# Patient Record
Sex: Female | Born: 2000 | Race: Black or African American | Hispanic: No | Marital: Single | Smoking: Never smoker
Health system: Southern US, Community
[De-identification: ages and names within clinical notes are randomized; demographics above are authoritative.]

## PROBLEM LIST (undated history)

## (undated) DIAGNOSIS — R51 Headache: Secondary | ICD-10-CM

## (undated) DIAGNOSIS — R519 Headache, unspecified: Secondary | ICD-10-CM

## (undated) HISTORY — DX: Headache: R51

## (undated) HISTORY — PX: NO PAST SURGERIES: SHX2092

## (undated) HISTORY — DX: Headache, unspecified: R51.9

---
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
## 2020-03-10 NOTE — L&D Delivery Note (Signed)
Delivery Note Pushed for almost 2 hours and was able to progress to delivery  At 12:24 AM a viable and healthy female was delivered via Vaginal, Spontaneous (Presentation: Middle Occiput Anterior).  APGAR: 8, 9; weight  .   Placenta status: Spontaneous, Intact.  Cord: 3 vessels with the following complications: None.    Anesthesia: Epidural Episiotomy: None Lacerations: 1st degree;Perineal Suture Repair:  4-0 monocryl Est. Blood Loss (mL): 1013  Mom to postpartum.  Baby to Couplet care / Skin to Skin.  Wynelle Bourgeois 03/03/2021, 1:14 AM  Please schedule this patient for Postpartum visit in: 1 week with the following provider: Any provider In-Person For C/S patients schedule nurse incision check in weeks 2 weeks: no High Risk pregnancy complicated by: HTN Delivery mode:  SVD Anticipated Birth Control:  POPs PP Procedures needed: BP check  Edinburgh: negative Schedule Integrated BH visit: no  No relevant baby issues

## 2020-07-05 ENCOUNTER — Ambulatory Visit: Payer: Self-pay

## 2020-07-05 ENCOUNTER — Encounter: Payer: Self-pay | Admitting: Obstetrics and Gynecology

## 2020-07-05 ENCOUNTER — Other Ambulatory Visit: Payer: Self-pay

## 2020-07-05 DIAGNOSIS — Z3200 Encounter for pregnancy test, result unknown: Secondary | ICD-10-CM

## 2020-07-05 DIAGNOSIS — Z3201 Encounter for pregnancy test, result positive: Secondary | ICD-10-CM

## 2020-07-05 LAB — POCT URINE PREGNANCY: Preg Test, Ur: POSITIVE — AB

## 2020-07-05 NOTE — Progress Notes (Signed)
Patient was assessed and managed by nursing staff during this encounter. I have reviewed the chart and agree with the documentation and plan. I have also made any necessary editorial changes.  Usbaldo Pannone, MD 07/05/2020 3:47 PM    

## 2020-07-05 NOTE — Progress Notes (Signed)
..   Joyce Taylor presents today for UPT. She has no unusual complaints. LMP:05-23-20    OBJECTIVE: Appears well, in no apparent distress.  OB History    Gravida  1   Para      Term      Preterm      AB      Living        SAB      IAB      Ectopic      Multiple      Live Births             Home UPT Result:Positive In-Office UPT result:Positive I have reviewed the patient's medical, obstetrical, social, and family histories, and medications.   ASSESSMENT: Positive pregnancy test  PLAN Prenatal care to be completed at: [facility]

## 2020-08-15 ENCOUNTER — Other Ambulatory Visit: Payer: Self-pay

## 2020-08-15 ENCOUNTER — Ambulatory Visit (INDEPENDENT_AMBULATORY_CARE_PROVIDER_SITE_OTHER): Payer: 59

## 2020-08-15 VITALS — BP 119/80 | HR 84 | Ht 67.0 in | Wt 132.5 lb

## 2020-08-15 DIAGNOSIS — Z3A12 12 weeks gestation of pregnancy: Secondary | ICD-10-CM

## 2020-08-15 DIAGNOSIS — Z3401 Encounter for supervision of normal first pregnancy, first trimester: Secondary | ICD-10-CM

## 2020-08-15 DIAGNOSIS — Z3403 Encounter for supervision of normal first pregnancy, third trimester: Secondary | ICD-10-CM | POA: Insufficient documentation

## 2020-08-15 MED ORDER — BLOOD PRESSURE KIT DEVI: 1.0000 | 0 refills | Status: AC

## 2020-08-15 NOTE — Progress Notes (Signed)
New OB Intake  I connected with  Tera Mater on 08/15/20 at  3:15 PM EDT by in person and verified that I am speaking with the correct person using two identifiers. Nurse is located at [facility] and pt is located at [place].  I discussed the limitations, risks, security and privacy concerns of performing an evaluation and management service by telephone and the availability of in person appointments. I also discussed with the patient that there may be a patient responsible charge related to this service. The patient expressed understanding and agreed to proceed.  I explained I am completing New OB Intake today. We discussed her EDD of 02/27/21 that is based on LMP of 05/23/20. Pt is G1/P0. I reviewed her allergies, medications, Medical/Surgical/OB history, and appropriate screenings. I informed her of [facility] services. Based on history, this is a/an uncomplicated pregnancy.  There are no problems to display for this patient.   Concerns addressed today  Delivery Plans:  Plans to deliver at [facility].   MyChart/Babyscripts MyChart access verified. I explained pt will have some visits in office and some virtually. Babyscripts instructions given and order placed. Patient verifies receipt of registration text/e-mail. Account successfully created and app downloaded.  Blood Pressure Cuff Blood pressure cuff ordered for patient to pick-up from [employer]. Explained after first prenatal appt pt will check weekly and document in Babyscripts.  Anatomy US Explained first scheduled [place] will be around 19 weeks. Patient has had a dating scan at [facility]  Labs Discussed Avelina Laine genetic screening with patient. Would like both Panorama and Horizon drawn at new OB visit. Routine prenatal labs needed.  Covid Vaccine Patient has covid vaccine.   Social Determinants of Health . Food Insecurity: Patient denies food insecurity. . WIC Referral: Patient is interested in referral to [facility].   . Transportation: Patient denies transportation needs. . Childcare: Discussed no children allowed at ultrasound appointments. Offered childcare services; patient declines childcare services at this time.  First visit review I reviewed new OB appt with pt. I explained she will have a pelvic exam, ob bloodwork with genetic screening, and PAP smear. Explained pt will be seen by Coral Ceo at first visit; encounter routed to appropriate provider. Explained that patient will be seen by pregnancy navigator following visit with provider.  Hamilton Capri, RN 08/15/2020  3:05 PM

## 2020-08-16 NOTE — Progress Notes (Signed)
Chart reviewed for nurse visit. Agree with plan of care.   Rakesh Dutko M, MD 08/16/20 10:28 AM 

## 2020-08-22 ENCOUNTER — Other Ambulatory Visit: Payer: Self-pay

## 2020-08-22 ENCOUNTER — Other Ambulatory Visit (HOSPITAL_COMMUNITY)
Admission: RE | Admit: 2020-08-22 | Discharge: 2020-08-22 | Disposition: A | Discharge status: Home / Self Care | Payer: 59 | Source: Ambulatory Visit | Attending: Obstetrics | Admitting: Obstetrics

## 2020-08-22 ENCOUNTER — Encounter: Payer: Self-pay | Admitting: Obstetrics

## 2020-08-22 ENCOUNTER — Ambulatory Visit (INDEPENDENT_AMBULATORY_CARE_PROVIDER_SITE_OTHER): Payer: 59 | Admitting: Obstetrics

## 2020-08-22 VITALS — Wt 134.4 lb

## 2020-08-22 DIAGNOSIS — Z3400 Encounter for supervision of normal first pregnancy, unspecified trimester: Secondary | ICD-10-CM

## 2020-08-22 DIAGNOSIS — Z3A13 13 weeks gestation of pregnancy: Secondary | ICD-10-CM | POA: Diagnosis not present

## 2020-08-22 DIAGNOSIS — Z3401 Encounter for supervision of normal first pregnancy, first trimester: Secondary | ICD-10-CM | POA: Insufficient documentation

## 2020-08-22 LAB — HEPATITIS C ANTIBODY: HCV Ab: NEGATIVE

## 2020-08-22 NOTE — Progress Notes (Signed)
Subjective:    Joyce Taylor is being seen today for her first obstetrical visit.  This is not a planned pregnancy. She is at [redacted]w[redacted]d gestation. Her obstetrical history is significant for  nothing . Relationship with FOB: significant other, not living together. Patient does intend to breast feed. Pregnancy history fully reviewed.  The information documented in the HPI was reviewed and verified.  Menstrual History: OB History     Gravida  1   Para      Term      Preterm      AB      Living         SAB      IAB      Ectopic      Multiple      Live Births               Patient's last menstrual period was 05/23/2020.    Past Medical History:  Diagnosis Date   Headache     History reviewed. No pertinent surgical history.  (Not in a hospital admission)  No Known Allergies  Social History   Tobacco Use   Smoking status: Never   Smokeless tobacco: Never  Substance Use Topics   Alcohol use: Not Currently    Comment: not since confirmed pregnancy    Family History  Problem Relation Age of Onset   Breast cancer Mother 39     Review of Systems Constitutional: negative for weight loss Gastrointestinal: negative for vomiting Genitourinary:negative for genital lesions and vaginal discharge and dysuria Musculoskeletal:negative for back pain Behavioral/Psych: negative for abusive relationship, depression, illegal drug usage and tobacco use    Objective:    Wt 134 lb 6.4 oz (61 kg)   LMP 05/23/2020   BMI 21.05 kg/m  General Appearance:    Alert, cooperative, no distress, appears stated age  Head:    Normocephalic, without obvious abnormality, atraumatic  Eyes:    PERRL, conjunctiva/corneas clear, EOM's intact, fundi    benign, both eyes  Ears:    Normal TM's and external ear canals, both ears  Nose:   Nares normal, septum midline, mucosa normal, no drainage    or sinus tenderness  Throat:   Lips, mucosa, and tongue normal; teeth and gums normal  Neck:    Supple, symmetrical, trachea midline, no adenopathy;    thyroid:  no enlargement/tenderness/nodules; no carotid   bruit or JVD  Back:     Symmetric, no curvature, ROM normal, no CVA tenderness  Lungs:     Clear to auscultation bilaterally, respirations unlabored  Chest Wall:    No tenderness or deformity   Heart:    Regular rate and rhythm, S1 and S2 normal, no murmur, rub   or gallop  Breast Exam:    No tenderness, masses, or nipple abnormality  Abdomen:     Soft, non-tender, bowel sounds active all four quadrants,    no masses, no organomegaly  Genitalia:    Normal female without lesion, discharge or tenderness  Extremities:   Extremities normal, atraumatic, no cyanosis or edema  Pulses:   2+ and symmetric all extremities  Skin:   Skin color, texture, turgor normal, no rashes or lesions  Lymph nodes:   Cervical, supraclavicular, and axillary nodes normal  Neurologic:   CNII-XII intact, normal strength, sensation and reflexes    throughout      Lab Review Urine pregnancy test Labs reviewed yes Radiologic studies reviewed no  Assessment:    Pregnancy at  [redacted]w[redacted]d weeks    Plan:    1. Supervision of normal first pregnancy, antepartum Rx: - Cytology - PAP( [place]) - Cervicovaginal ancillary only( [place]) - Genetic Screening - Culture, OB Urine - Obstetric Panel, Including HIV - Hepatitis C antibody   Prenatal vitamins.  Counseling provided regarding continued use of seat belts, cessation of alcohol consumption, smoking or use of illicit drugs; infection precautions i.e., influenza/TDAP immunizations, toxoplasmosis,CMV, parvovirus, listeria and varicella; workplace safety, exercise during pregnancy; routine dental care, safe medications, sexual activity, hot tubs, saunas, pools, travel, caffeine use, fish and methlymercury, potential toxins, hair treatments, varicose veins Weight gain recommendations per IOM guidelines reviewed: underweight/BMI< 18.5--> gain 28 - 40 lbs;  normal weight/BMI 18.5 - 24.9--> gain 25 - 35 lbs; overweight/BMI 25 - 29.9--> gain 15 - 25 lbs; obese/BMI >30->gain  11 - 20 lbs Problem list reviewed and updated. FIRST/CF mutation testing/NIPT/QUAD SCREEN/fragile X/Ashkenazi Jewish population testing/Spinal muscular atrophy discussed: requested. Role of ultrasound in pregnancy discussed; fetal survey: requested. Amniocentesis discussed: not indicated.   Orders Placed This Encounter  Procedures   Culture, OB Urine   Genetic Screening   Obstetric Panel, Including HIV   Hepatitis C antibody    Follow up in 4 weeks.  Brock Bad, MD 08/22/2020 10:38 AM

## 2020-08-22 NOTE — Progress Notes (Addendum)
.  [facility] has a partnership with the [employer] to provide prenatal navigation for the most needed resources in our community. In order to see how we can help connect you to these resources we need consent to contact you. Please complete the very short consent using the link below:   English Link: [email or web address].[email or web address]  Spanish Link: [email or web address].[email or web address]?[email or web address]  New OB Denies N/V at present. Denies any complaints.

## 2020-08-23 LAB — OBSTETRIC PANEL, INCLUDING HIV
Antibody Screen: NEGATIVE
Basophils Absolute: 0 10*3/uL (ref 0.0–0.2)
Basos: 0 %
EOS (ABSOLUTE): 0 10*3/uL (ref 0.0–0.4)
Eos: 1 %
HIV Screen 4th Generation wRfx: NONREACTIVE
Hematocrit: 36.5 % (ref 34.0–46.6)
Hemoglobin: 11.8 g/dL (ref 11.1–15.9)
Hepatitis B Surface Ag: NEGATIVE
Immature Grans (Abs): 0 10*3/uL (ref 0.0–0.1)
Immature Granulocytes: 0 %
Lymphocytes Absolute: 1.3 10*3/uL (ref 0.7–3.1)
Lymphs: 26 %
MCH: 28 pg (ref 26.6–33.0)
MCHC: 32.3 g/dL (ref 31.5–35.7)
MCV: 87 fL (ref 79–97)
Monocytes Absolute: 0.3 10*3/uL (ref 0.1–0.9)
Monocytes: 6 %
Neutrophils Absolute: 3.4 10*3/uL (ref 1.4–7.0)
Neutrophils: 67 %
Platelets: 186 10*3/uL (ref 150–450)
RBC: 4.22 x10E6/uL (ref 3.77–5.28)
RDW: 14 % (ref 11.7–15.4)
RPR Ser Ql: NONREACTIVE
Rh Factor: POSITIVE
Rubella Antibodies, IGG: 2.48 index (ref >0.99)
WBC: 5.1 10*3/uL (ref 3.4–10.8)

## 2020-08-23 LAB — CERVICOVAGINAL ANCILLARY ONLY
Bacterial Vaginitis (gardnerella): POSITIVE — AB
Candida Glabrata: NEGATIVE
Candida Vaginitis: NEGATIVE
Chlamydia: POSITIVE — AB
Comment: NEGATIVE
Comment: NEGATIVE
Comment: NEGATIVE
Comment: NEGATIVE
Comment: NEGATIVE
Comment: NORMAL
Neisseria Gonorrhea: NEGATIVE
Trichomonas: NEGATIVE

## 2020-08-23 LAB — HEPATITIS C ANTIBODY: Hep C Virus Ab: <0.1 s/co ratio (ref 0.0–0.9)

## 2020-08-24 ENCOUNTER — Telehealth: Payer: Self-pay

## 2020-08-24 ENCOUNTER — Other Ambulatory Visit: Payer: Self-pay | Admitting: Obstetrics

## 2020-08-24 DIAGNOSIS — N760 Acute vaginitis: Secondary | ICD-10-CM

## 2020-08-24 DIAGNOSIS — A749 Chlamydial infection, unspecified: Secondary | ICD-10-CM

## 2020-08-24 LAB — CULTURE, OB URINE

## 2020-08-24 LAB — URINE CULTURE, OB REFLEX

## 2020-08-24 MED ORDER — AZITHROMYCIN 500 MG PO TABS
1000.0000 mg | ORAL_TABLET | Freq: Once | ORAL | 0 refills | Status: AC
Start: 2020-08-24 — End: 2020-08-24

## 2020-08-24 MED ORDER — METRONIDAZOLE 500 MG PO TABS: 500.0000 mg | ORAL_TABLET | Freq: Two times a day (BID) | ORAL | 2 refills | Status: DC

## 2020-08-24 NOTE — Telephone Encounter (Signed)
-----   Message from Brock Bad, MD sent at 08/24/2020  8:30 AM EDT ----- Azithromycin Rx for Chlamydia Flagyl Rx for BV

## 2020-08-24 NOTE — Telephone Encounter (Signed)
TC to patient, no answer or voice mail to leave a message.

## 2020-08-27 LAB — CYTOLOGY - PAP

## 2020-08-29 ENCOUNTER — Encounter: Payer: Self-pay | Admitting: Obstetrics

## 2020-09-03 ENCOUNTER — Encounter: Payer: Self-pay | Admitting: Obstetrics

## 2020-09-04 ENCOUNTER — Other Ambulatory Visit: Payer: Self-pay

## 2020-09-04 MED ORDER — PROMETHAZINE HCL 25 MG PO TABS: 25.0000 mg | ORAL_TABLET | Freq: Four times a day (QID) | ORAL | 0 refills | Status: DC | PRN

## 2020-09-04 NOTE — Telephone Encounter (Signed)
Patient is requesting something for nausea. 

## 2020-09-19 ENCOUNTER — Other Ambulatory Visit (HOSPITAL_COMMUNITY)
Admission: RE | Admit: 2020-09-19 | Discharge: 2020-09-19 | Disposition: A | Discharge status: Home / Self Care | Payer: 59 | Source: Ambulatory Visit | Attending: Women's Health | Admitting: Women's Health

## 2020-09-19 ENCOUNTER — Other Ambulatory Visit: Payer: Self-pay

## 2020-09-19 ENCOUNTER — Ambulatory Visit (INDEPENDENT_AMBULATORY_CARE_PROVIDER_SITE_OTHER): Payer: 59 | Admitting: Women's Health

## 2020-09-19 VITALS — BP 117/65 | HR 90 | Wt 135.6 lb

## 2020-09-19 DIAGNOSIS — Z3A17 17 weeks gestation of pregnancy: Secondary | ICD-10-CM

## 2020-09-19 DIAGNOSIS — Z3401 Encounter for supervision of normal first pregnancy, first trimester: Secondary | ICD-10-CM

## 2020-09-19 DIAGNOSIS — O98312 Other infections with a predominantly sexual mode of transmission complicating pregnancy, second trimester: Secondary | ICD-10-CM

## 2020-09-19 DIAGNOSIS — A749 Chlamydial infection, unspecified: Secondary | ICD-10-CM | POA: Diagnosis present

## 2020-09-19 DIAGNOSIS — D563 Thalassemia minor: Secondary | ICD-10-CM

## 2020-09-19 DIAGNOSIS — A568 Sexually transmitted chlamydial infection of other sites: Secondary | ICD-10-CM

## 2020-09-19 NOTE — Progress Notes (Signed)
Subjective:  Joyce Taylor is a 20 y.o. G1P0 at [redacted]w[redacted]d being seen today for ongoing prenatal care.  She is currently monitored for the following issues for this low-risk pregnancy and has Encounter for supervision of normal first pregnancy in first trimester and Alpha thalassemia silent carrier on their problem list.  Patient reports no complaints.  Contractions: Not present. Vag. Bleeding: None.   . Denies leaking of fluid.   The following portions of the patient's history were reviewed and updated as appropriate: allergies, current medications, past family history, past medical history, past social history, past surgical history and problem list. Problem list updated.  Objective:   Vitals:   09/19/20 1518  BP: 117/65  Pulse: 90  Weight: 135 lb 9.6 oz (61.5 kg)    Fetal Status: Fetal Heart Rate (bpm): 165         General:  Alert, oriented and cooperative. Patient is in no acute distress.  Skin: Skin is warm and dry. No rash noted.   Cardiovascular: Normal heart rate noted  Respiratory: Normal respiratory effort, no problems with respiration noted  Abdomen: Soft, gravid, appropriate for gestational age. Pain/Pressure: Absent     Pelvic: Vag. Bleeding: None     Cervical exam deferred        Extremities: Normal range of motion.  Edema: None  Mental Status: Normal mood and affect. Normal behavior. Normal judgment and thought content.   Urinalysis:      Assessment and Plan:  Pregnancy: G1P0 at [redacted]w[redacted]d  1. Encounter for supervision of normal first pregnancy in first trimester - peds list given - CBE info given - doula info given per pt request - pt declines AFP today, will consider for next visit - [place] MFM OB COMP + 14 WK; Future  2. [redacted] weeks gestation of pregnancy  3. Alpha thalassemia silent carrier - AMB MFM GENETICS REFERRAL  Preterm labor symptoms and general obstetric precautions including but not limited to vaginal bleeding, contractions, leaking of fluid and fetal movement  were reviewed in detail with the patient. I discussed the assessment and treatment plan with the patient. The patient was provided an opportunity to ask questions and all were answered. The patient agreed with the plan and demonstrated an understanding of the instructions. The patient was advised to call back or seek an in-person office evaluation/go to [facility] at [facility] for any urgent or concerning symptoms. Please refer to After Visit Summary for other counseling recommendations.  Return in about 4 weeks (around 10/17/2020) for in-person LOB/APP OK/AFP, needs anatomy scan scheduled ASAP.   Levone Otten, Odie Sera, NP

## 2020-09-19 NOTE — Addendum Note (Signed)
Addended by: Kennon Portela on: 09/19/2020 04:50 PM   Modules accepted: Orders

## 2020-09-19 NOTE — Patient Instructions (Addendum)
Maternity Assessment Unit (MAU)  The Maternity Assessment Unit (MAU) is located at the [facility]. The address is: [address] [place], [place], [place] [address]. Please see map below for additional directions.    The Maternity Assessment Unit is designed to help you during your pregnancy, and for up to 6 weeks after delivery, with any pregnancy- or postpartum-related emergencies, if you think you are in labor, or if your water has broken. For example, if you experience nausea and vomiting, vaginal bleeding, severe abdominal or pelvic pain, elevated blood pressure or other problems related to your pregnancy or postpartum time, please come to the Maternity Assessment Unit for assistance.       AREA PEDIATRIC/FAMILY PRACTICE PHYSICIANS  [facility] [address] [place], [place] [address] Phone - [phone]   Fax - [place] [address] [place], [place]  [address] Phone - [phone]   Fax - [phone]  [place] [address] [place] [address] [place], [place]  [address] Phone - [phone]   Fax - [phone]  [facility] [address] [place], [place]  [address] Phone - [phone]   Fax - [phone]  [place] [address], [place], [place]  [address] Phone - [phone]   Fax - [place] [address] [place], [place]  [address] Phone - [phone]   Fax - [place] [facility] [place] [address], [place], [place]  [address] Phone - [phone]   Fax - [phone]  [place] [address] [place], [place], [place]  [address] Phone - [phone]   Fax - [place] [address] [place], [place]  [address] Phone - [phone]   Fax -  [phone] [facility] [place]. [address] [place], [place]  [address] Phone - [phone]   Fax - [phone]  [place] [address] [place], [place]  [address] Phone - [phone]   Fax - [phone]  [facility] [place], [place], [place]  [address] Phone - [phone]   Fax - [place] [address], [place], [place]  [address] Phone - [phone]   Fax - [phone]  [facility] [address], [place], [place]  [address] Phone - [place], [place]  [address] Phone - [phone]   Fax - [place] [address], [place], [place]  [address] Phone - [phone]   Fax - [phone]  [place] [address] [place], [place]  [address] Phone - [phone]   Fax - [place]. [place], [place]  [address] Phone - [phone]   Fax - [place], [place], [place]  [address] Phone - [phone]   Fax - [place] [address], [address] [place], [place]  [address] Phone - [phone]   Fax - [phone]  DAVID RUBIN [address] [place], [place]  [address] Phone - [phone]   Fax - [place] [address] [place],   [address] Phone - [phone]  Fax - [phone]  [facility] [address] [place], [place]  [address] Phone - [phone]   Fax - [phone] Gerarda Fraction [phone] [address] [place], [place]  [address] Phone - [phone]   Fax - [phone]  [facility] [address] [place], [place]  [address] Phone - [phone]   Fax - [phone]  [facility] -  [address] [place], [place]  [address] Phone - [phone]   Fax - [phone]        Childbirth Education Options: [facility] Department Classes:  Childbirth education classes can help you get ready for a positive parenting experience. You can also meet other expectant parents and get free stuff for your baby. Each class runs for five weeks on the same night and costs $45 for the mother-to-be and her support person. Medicaid covers the cost if you are eligible. Call [phone] to register. [facility] Childbirth Education:  Register at [email or web address]   There are fees associated with some of these classes. Check website for most up-to-date information.   Baby & Me Class: Discuss newborn & infant parenting and family adjustment issues with other new mothers in a relaxed environment. Each week brings a new speaker or baby-centered activity. We encourage new mothers to join [place] every Thursday at 11:00am. Babies birth until crawling. No registration or fee. Daddy [employer]: This course offers Dads-to-be the tools and knowledge needed to feel confident on their journey to becoming new fathers. Experienced dads, who have been trained as coaches, teach dads-to-be how to hold, comfort, diaper, swaddle and play with their infant while being able to support the new mom as well. A class for men taught by men.  Big Brother/Big Sister: Let your children share in the joy of a new brother or sister in this special class designed just for them. Class includes discussion about how families care for babies: swaddling, holding, diapering, safety as well as how they can be helpful in their new role. This class is designed for children ages 2 to 76, but any age is welcome. Please register each child individually.  Mom Talk: This mom-led group offers support and connection to mothers as they journey through the adjustments and struggles  of that sometimes overwhelming first year after the birth of a child. Tuesdays at 10:00am and Thursdays at 6:00pm. Babies welcome. No registration or fee. Breastfeeding Support Group: This group is a mother-to-mother support circle where moms have the opportunity to share their breastfeeding experiences. A Lactation Consultant is present for questions and concerns. Meets each Tuesday at 11:00am. No fee or registration. Breastfeeding Your Baby: Learn what to expect in the first days of breastfeeding your newborn.  This class will help you feel more confident with the skills needed to begin your breastfeeding experience. Many new mothers are concerned about breastfeeding after leaving the hospital. This class will also address the most common fears and challenges about breastfeeding during the first few weeks, months and beyond. (call for fee) Comfort Techniques and Tour: This 2 hour interactive class will provide you the opportunity to learn & practice hands-on techniques that can help relieve some of the discomfort of labor and encourage your baby to rotate toward the best position for birth. You and your partner will be able to try a variety of labor positions with birth balls and rebozos as well as practice breathing, relaxation, and visualization techniques. A tour of the [facility]  Care Center is included with this class.  Childbirth Class- Weekend Option: This class is a Weekend version of our Birth & Baby series. It is designed for parents who have a difficult time fitting several weeks of classes into their schedule. It covers the care of your newborn and the basics of labor and childbirth. It also includes a Maternity Care Center Tour of [facility] and lunch. The class is held two consecutive days: beginning on Friday evening from 6:30 - 8:30 p.m. and the next day, Saturday from 9 a.m. - 4 p.m. (call for fee) Linden Dolin Class: Interested in a waterbirth?  This informational class  will help you discover whether waterbirth is the right fit for you. Education about waterbirth itself, supplies you would need and how to assemble your support team is what you can expect from this class. Some obstetrical practices require this class in order to pursue a waterbirth. (Not all obstetrical practices offer waterbirth-check with your healthcare provider.) Register only the expectant mom, but you are encouraged to bring your partner to class! Required if planning waterbirth, no fee. Infant/Child CPR: Parents, grandparents, babysitters, and friends learn Cardio-Pulmonary Resuscitation skills for infants and children. You will also learn how to treat both conscious and unconscious choking in infants and children. This Family & Friends program does not offer certification. Register each participant individually to ensure that enough mannequins are available. (Call for fee) Grandparent Love: Expecting a grandbaby? This class is for you! Learn about the latest infant care and safety recommendations and ways to support your own child as he or she transitions into the parenting role. Taught by Registered Nurses who are childbirth instructors, but most importantly...they are grandmothers too! Childbirth Class- Natural Childbirth: This series of 5 weekly classes is for expectant parents who want to learn and practice natural methods of coping with the process of labor and childbirth. Relaxation, breathing, massage, visualization, role of the partner, and helpful positioning are highlighted. Participants learn how to be confident in their body's ability to give birth. This class will empower and help parents make informed decisions about their own care. Includes discussion that will help new parents transition into the immediate postpartum period. Maternity Care Center Tour of [facility] is included. We suggest taking this class between 25-32 weeks, but it's only a recommendation. Childbirth Class- 3 week  Series: This option of 3 weekly classes helps you and your labor partner prepare for childbirth. Newborn care, labor & birth, cesarean birth, pain management, and comfort techniques are discussed and a Maternity Care Center Tour of [facility] is included. The class meets at the same time, on the same day of the week for 3 consecutive weeks beginning with the starting date you choose.  Marvelous Multiples: Expecting twins, triplets, or more? This class covers the differences in labor, birth, parenting, and breastfeeding issues that face multiples' parents. NICU tour is included. Led by a Certified Childbirth Educator who is the mother of twins. No fee. Caring for Baby: This class is for expectant and adoptive parents who want to learn and practice the most up-to-date newborn care for their babies. Focus is on birth through the first six weeks of life. Topics include feeding, bathing, diapering, crying, umbilical cord care, circumcision care and safe sleep. Parents learn to recognize symptoms of illness and when to call the pediatrician. Register only the mom-to-be and your partner or support person can plan to come with you!  Childbirth Class- online option: This online class offers you the  freedom to complete a Birth and Baby series in the comfort of your own home. The flexibility of this option allows you to review sections at your own pace, at times convenient to you and your support people. It includes additional video information, animations, quizzes, and extended activities. Get organized with helpful eClass tools, checklists, and trackers. Once you register online for the class, you will receive an email within a few days to accept the invitation and begin the class when the time is right for you. The content will be available to you for 60 days.             Alpha-Fetoprotein Test Why am I having this test? The alpha-fetoprotein test is a lab test most commonly used for pregnant women to  help screen for birth defects in their unborn baby. It can be used to screen for chromosome (DNA) abnormalities, problems with the brain or spinal cord, or problems with the abdominal wall of the unborn baby (fetus). The alpha-fetoprotein test may also be done for men or nonpregnant women tocheck for certain cancers. What is being tested? This test measures the amount of alpha-fetoprotein (AFP) in your blood. AFP is a protein that is made by the liver. Levels can be detected in the mother's blood during pregnancy, starting at 10 weeks and peaking at 16-18 weeks of the pregnancy. Abnormal levels can sometimes be a sign of a birth defect in thebaby. Certain cancers can cause a high level of AFP in men and nonpregnant women. What kind of sample is taken?  A blood sample is required for this test. It is usually collected by insertinga needle into a blood vessel. How are the results reported? Your test results will be reported as values. Your health care provider will compare your results to normal ranges that were established after testing a large group of people (reference values). Reference values may vary among labs and hospitals. For this test, common reference values are: Adult: Less than 40 ng/mL or less than 40 mcg/L (SI units). Child younger than 1 year: Less than 30 ng/mL. If you are pregnant, the values may also vary based on how long you have beenpregnant. What do the results mean? Results that are above the reference values in pregnant women may indicate the following for the baby: Neural tube defects, such as abnormalities of the spinal cord or brain. Abdominal wall defects. Multiple pregnancy such as twins. Fetal distress or fetal death. Results that are above the reference values in men or nonpregnant women may indicate: Reproductive cancers, such as ovarian or testicular cancer. Liver cancer. Liver cell death. Other types of cancer. Very low levels of AFP in pregnant women may  indicate Down syndrome for thebaby. Talk with your health care provider about what your results mean. Questions to ask your health care provider Ask your health care provider, or the department that is doing the test: When will my results be ready? How will I get my results? What are my treatment options? What other tests do I need? What are my next steps? Summary The alpha-fetoprotein test is done on pregnant women to help screen for birth defects in their unborn baby. Certain cancers can cause a high level of AFP in men and nonpregnant women. For this test, a blood sample is usually collected by inserting a needle into a blood vessel. Talk with your health care provider about what your results mean. This information is not intended to replace advice given to you by your  health care provider. Make sure you discuss any questions you have with your healthcare provider. Document Revised: 09/16/2019 Document Reviewed: 09/16/2019 Elsevier Patient Education  2022 Elsevier Inc.       Thalassemia  Thalassemia is a blood disorder that causes a low level of red blood cells (anemia). This condition is passed from parent to child through abnormal genes (gene mutations). The mutations make it hard for your body to make the protein in red blood cells (hemoglobin) that carries oxygen from your lungs to the rest of your body. Red blood cells do not live long without hemoglobin. Loss of red blood cells leads to anemia,which is the main symptom of thalassemia. There are two main types of thalassemia. The type depends on which part of the hemoglobin is affected. Alpha thalassemia affects the alpha part of the hemoglobin. This is caused by four genes. You could get two from each parent. Beta thalassemia affects the beta part of the hemoglobin. This is caused by two genes. You could get one from each parent. Thalassemia can be mild or severe. It depends on how many gene mutations you are born with. The  more gene mutations you get, the more severe the condition. A person who inherits just one gene will be a carrier of the condition (thalassemia trait). A person with thalassemia trait may not have any symptoms or may have only mild anemia. A person who inherits two or more genes can have thalassemiaminor, thalassemia intermedia, or thalassemia major. Thalassemia is a lifelong condition. There is no cure, but treatment cancontrol symptoms and manage the condition. What are the causes? Thalassemia is cause by gene mutations that are passed down through families. What increases the risk? You are more likely to develop this condition if: You have a family history of thalassemia.  Your ancestors are from [place], [place], [place], [place], [place], or the [place] What are the signs or symptoms? The most common signs and symptoms of thalassemia are the signs and symptoms of anemia. They include: Weakness. Tiredness. Pounding heartbeat. Dizziness. Headache. Leg cramps. Pale skin. Confusion. Shortness of breath. Other signs and symptoms can also occur. You may have: Yellow eyes or skin, and dark urine (jaundice). The breakdown of red blood cells can cause a yellowing pigment (bilirubin) to build up in your blood. Weak bones (osteoporosis) and bone fractures. This is because bones can weaken from the effort of making more hemoglobin. An enlarged spleen. This can lead to a swollen belly. Your spleen can become enlarged from filtering dead red blood cells. Frequent, severe infections. This occurs if your spleen and bone marrow become weak. These organs make white blood cells that your body needs to fight infections. How is this diagnosed? Your health care provider may suspect thalassemia based on your signs and symptoms, especially if you have a family history of the condition. This condition may be diagnosed: In childhood, if you have severe forms of thalassemia. This is because symptoms show  early in life. At birth. In the [place] babies are screened for this condition. In adulthood, if you have thalassemia trait or thalassemia minor. This happens if symptoms of anemia start or if a routine blood test shows unexplained anemia. Blood tests can confirm a thalassemia diagnosis. Blood tests may show: Low hemoglobin. Low iron. Abnormal hemoglobin. Thalassemia gene mutations. You may need to see a health care provider who specializes in blood diseases (hematologist). How is this treated? Treatment for this condition depends on the type of thalassemia that you have:  If you have thalassemia trait or thalassemia minor, you may not need treatment. However, you may need treatment if you have thalassemia minor and you develop symptoms during an infection. If you have thalassemia intermedia, you will have symptoms that require treatment. If you have major thalassemia, you will have serious symptoms that require regular treatment. Thalassemia treatment may include: Donated blood (transfusions) to replace red blood cells. Vitamin B (folic acid) supplements to help produce hemoglobin and red blood cells. Medicines or injections to remove iron buildup (chelation). This can happen in people who have frequent transfusions. Iron overload can damage heart, liver, and brain cells. In the case of severe thalassemia: The spleen may need to be removed if it becomes damaged. Stem cell or bone marrow transplants may be necessary to transplant cells that can make red blood cells. This may be done if transfusions are not working. Follow these instructions at home: Eating and drinking  Follow instructions from your health care provider about eating or drinking restrictions. You may need to avoid foods or drinks that are high in iron or fortified with iron. Eat foods that are high in fiber, such as fresh fruits and vegetables, whole grains, and beans. Limit foods that are high in fat and processed sugars, such  as fried and sweet foods. Nutrition is important for preventing anemia.  Activity Return to your normal activities as told by your health care provider. Ask your health care provider what activities are safe for you. Exercise is important for maintaining energy and strong bones. Ask your health care provider what amount and type of exercise is safe for you. General instructions  Take over-the-counter and prescription medicines only as told by your health care provider. Keep all routine vaccinations and flu shots up to date to reduce your risk of infection. Wash your hands frequently. Do your best to avoid sick people, and stay out of crowds during cold and flu seasons. Meet with a Dentistgenetic counselor if you are or may become pregnant. A genetic counselor can explain the risks of passing thalassemia to a child. Keep all follow-up visits as told by your health care provider. This is important.  Contact a health care provider if: You have signs or symptoms of anemia. You have a fever or other signs of infection. Your belly is swollen. You have jaundice. Get help right away if: You feel very weak or short of breath. Summary Thalassemia is a blood disorder that causes anemia. Thalassemia can range from mild to severe. This condition is passed down through families. There is no cure, but treatment can manage the symptoms and prevent anemia. This information is not intended to replace advice given to you by your health care provider. Make sure you discuss any questions you have with your healthcare provider. Document Revised: 06/23/2017 Document Reviewed: 06/23/2017 Elsevier Patient Education  2022 Elsevier Inc.       Preterm Labor The normal length of a pregnancy is 39-41 weeks. Preterm labor is when labor starts before 37 completed weeks of pregnancy. Babies who are born prematurely and survive may not be fully developed and may be at an increased risk for long-term problems such as  cerebral palsy, developmental delays, and vision andhearing problems. Babies who are born too early may have problems soon after birth. Premature babies may have problems regulating blood sugar, body temperature, heart rate, and breathing rate. These babies often have trouble with feeding. The risk ofhaving problems is highest for babies who are born before 34 weeks  of pregnancy. What are the causes? The exact cause of this condition is not known. What increases the risk? You are more likely to have preterm labor if you have certain risk factors that relate to your medical history, problems with present and past pregnancies, andlifestyle factors. Medical history You have abnormalities of the uterus, including a short cervix. You have STIs (sexually transmitted infections) or other infections of the urinary tract and the vagina. You have chronic illnesses, such as blood clotting problems, diabetes, or high blood pressure. You are overweight or underweight. Present and past pregnancies You have had preterm labor before. You are pregnant with twins or other multiples. You have been diagnosed with a condition in which the placenta covers your cervix (placenta previa). You waited less than 18 months between giving birth and becoming pregnant again. Your unborn baby has some abnormalities. You have vaginal bleeding during pregnancy. You became pregnant through in vitro fertilization (IVF). Lifestyle and environmental factors You use tobacco products or drink alcohol. You use drugs. You have stress and no social support. You experience domestic violence. You are exposed to certain chemicals or environmental pollutants. Other factors You are younger than age 25 or older than age 44. What are the signs or symptoms? Symptoms of this condition include: Cramps similar to those that can happen during a menstrual period. The cramps may happen with diarrhea. Pain in the abdomen or lower back. Regular  contractions that may feel like tightening of the abdomen. A feeling of increased pressure in the pelvis. Increased watery or bloody mucus discharge from the vagina. Water breaking (ruptured amniotic sac). How is this diagnosed? This condition is diagnosed based on: Your medical history and a physical exam. A pelvic exam. An ultrasound. Monitoring your uterus for contractions. Other tests, including: A swab of the cervix to check for a chemical called fetal fibronectin. Urine tests. How is this treated? Treatment for this condition depends on the length of your pregnancy, your condition, and the health of your baby. Treatment may include: Taking medicines, such as: Hormone medicines. These may be given early in pregnancy to help support the pregnancy. Medicines to stop contractions. Medicines to help mature the baby's lungs. These may be prescribed if the risk of delivery is high. Medicines to help protect your baby from brain and nerve complications such as cerebral palsy. Bed rest. If the labor happens before 34 weeks of pregnancy, you may need to stay in the hospital. Delivery of the baby. Follow these instructions at home:  Do not use any products that contain nicotine or tobacco. These products include cigarettes, chewing tobacco, and vaping devices, such as e-cigarettes. If you need help quitting, ask your health care provider. Do not drink alcohol. Take over-the-counter and prescription medicines only as told by your health care provider. Rest as told by your health care provider. Return to your normal activities as told by your health care provider. Ask your health care provider what activities are safe for you. Keep all follow-up visits. This is important. How is this prevented? To increase your chance of having a full-term pregnancy: Do not use drugs or take medicines that have not been prescribed to you during your pregnancy. Talk with your health care provider before  taking any herbal supplements, even if you have been taking them regularly. Make sure you gain a healthy amount of weight during your pregnancy. Watch for infection. If you think that you might have an infection, get it checked right away. Symptoms of infection  may include: Fever. Abnormal vaginal discharge or discharge that smells bad. Pain or burning with urination. Needing to urinate urgently. Frequently urinating or passing small amounts of urine frequently. Blood in your urine or urine that smells bad or unusual. Where to find more information U.S. Department of Health and Cytogeneticist on Women's Health: http://hoffman.com/ [employer] of Obstetricians and Gynecologists: www.acog.org Centers for Disease Control and Prevention, Preterm Birth: FootballExhibition.com.br Contact a health care provider if: You think you are going into preterm labor. You have signs or symptoms of preterm labor. You have symptoms of infection. Get help right away if: You are having regular, painful contractions every 5 minutes or less. Your water breaks. Summary Preterm labor is labor that starts before you reach 37 weeks of pregnancy. Delivering your baby early increases your baby's risk of developing long-term problems. You are more likely to have preterm labor if you have certain risk factors that relate to your medical history, problems with present and past pregnancies, and lifestyle factors. Keep all follow-up visits. This is important. Contact a health care provider if you have signs or symptoms of preterm labor. This information is not intended to replace advice given to you by your health care provider. Make sure you discuss any questions you have with your healthcare provider. Document Revised: 02/28/2020 Document Reviewed: 02/28/2020 Elsevier Patient Education  2022 [employer].       DOULA LIST   [facility]oul[facility]  [place]  [phone]   [email or web address]  [email or web address]  [place] the [employer] Price [phone]  [email or web address]   [employer]   Precious Danford Bad   [email or web address]   ??THE MOTHERLY DOULA?? Zola Button   [phone]   [email or web address] .[email or web address]     [facility] [employer] [employer] Doul[facility]  Olive Bass  [phone]    [email or web address]   [employer] [facility]  Angie Rosier     [phone]     [email or web address]   Renato Gails: Doula & Photographer   Renato Gails [phone]       [email or web address]  [email or web address]   [employer] [facility]a [facility]  [place] [phone]   [email or web address]   [place]  Lolita Rieger  [phone]  [email or web address]   [email or web address]   [facility]   Kizzie Furnish   [phone]  [email or web address]   Dina Rich [place] Doula  Dina Rich  [phone] [email or web address]  [facility] Doulas  Cornelious Bryant         [facility]       Lora Reynolds     [phone] [email or web address]  [email or web address]   [facility]   [place] [phone] [email or web address]   [facility] Doula Services  Camelia Eng     [phone]  [email or web address]  [facility]     [phone]  [email or web address]  Facebook and IG [email or web address] .  [phone] [email or web address] .[phone] [email or web address]   [phone]  [phone] [email or web address]     [facility]  [phone] [email or web address]   IG [email or web address]    [phone]    [phone] [email or web address]   Lurline Hare "[place]" Free  [phone] [email or web address]     Mtende Roll  [phone] [email or web address]   Susie Williams   [email or web address]    [phone]    [phone] [email or web address]     Denita Lung  [phone] [email or web address]    Lenard Galloway  [phone] [email or web address]    Kizzie Ide  [place]    [phone]   [facility] Lucky Cowboy  [phone] [email or web address]  Doula Mama Maryjean Ka [phone] [email or web address]  [facility] [employer]  [phone] [email or web address]  [facility] Enzo Montgomery [phone]  [email or web address]  [email or web address] Kell Harris-Jones  [phone] [email or web address] .[email or web address]   Ardine Eng [phone] [email or web address]   Gilda Crease [phone] [email or web address]   Jonelle Sports  [phone] [email or web address]  [employer]  Jackelyn Poling  [phone] [email or web address]   Cristina Gong [phone] [email or web address]  MOOR Divine Myeka Dunn  [email or web address]   Glory Buff [phone] [email or web address]   Whitney Muse [phone] [email or web address]   Eldridge Abrahams [phone] [email or web address]

## 2020-09-19 NOTE — Progress Notes (Signed)
Pt not feeling fetal movement yet, denies pain.

## 2020-09-21 LAB — CERVICOVAGINAL ANCILLARY ONLY
Chlamydia: POSITIVE — AB
Comment: NEGATIVE
Comment: NEGATIVE
Comment: NORMAL
Neisseria Gonorrhea: NEGATIVE
Trichomonas: NEGATIVE

## 2020-09-24 ENCOUNTER — Telehealth: Payer: Self-pay

## 2020-09-24 ENCOUNTER — Other Ambulatory Visit: Payer: Self-pay

## 2020-09-24 DIAGNOSIS — A749 Chlamydial infection, unspecified: Secondary | ICD-10-CM

## 2020-09-24 DIAGNOSIS — O98812 Other maternal infectious and parasitic diseases complicating pregnancy, second trimester: Secondary | ICD-10-CM

## 2020-09-24 MED ORDER — AZITHROMYCIN 500 MG PO TABS: 1000.0000 mg | ORAL_TABLET | Freq: Once | ORAL | 1 refills | Status: AC

## 2020-09-24 NOTE — Telephone Encounter (Signed)
Patient sent a mychart message regarding treatment for positive chlamydia. Patient advised to inform partner of positive results and the need for treatment at either his primary care or health department. Also, advised to abstain from intercourse for at least a week after she and her partner has completed treatment. Patient informed that she will have a TOC in about of month to make sure infection is gone. Patient verbalized understanding. Azithromycin rx sent to pharmacy.  [facility] form completed

## 2020-10-08 ENCOUNTER — Other Ambulatory Visit: Payer: Self-pay

## 2020-10-08 MED ORDER — AZITHROMYCIN 500 MG PO TABS: ORAL_TABLET | ORAL | 0 refills | Status: DC

## 2020-10-17 ENCOUNTER — Other Ambulatory Visit: Payer: Self-pay | Admitting: Women's Health

## 2020-10-17 ENCOUNTER — Ambulatory Visit (INDEPENDENT_AMBULATORY_CARE_PROVIDER_SITE_OTHER): Payer: 59 | Admitting: Women's Health

## 2020-10-17 ENCOUNTER — Other Ambulatory Visit: Payer: Self-pay

## 2020-10-17 ENCOUNTER — Ambulatory Visit: Payer: 59 | Attending: Women's Health

## 2020-10-17 VITALS — BP 123/74 | HR 78 | Wt 142.0 lb

## 2020-10-17 DIAGNOSIS — Z3A21 21 weeks gestation of pregnancy: Secondary | ICD-10-CM

## 2020-10-17 DIAGNOSIS — Z3401 Encounter for supervision of normal first pregnancy, first trimester: Secondary | ICD-10-CM | POA: Insufficient documentation

## 2020-10-17 IMAGING — US US MFM OB DETAIL+14 WK
1 series · 12 of 28 positions shown · non-contrast
Comparison: none

[Series 1: us mfm ob detail+14 wk · 12 of 100 slices shown]
[im 4/100]
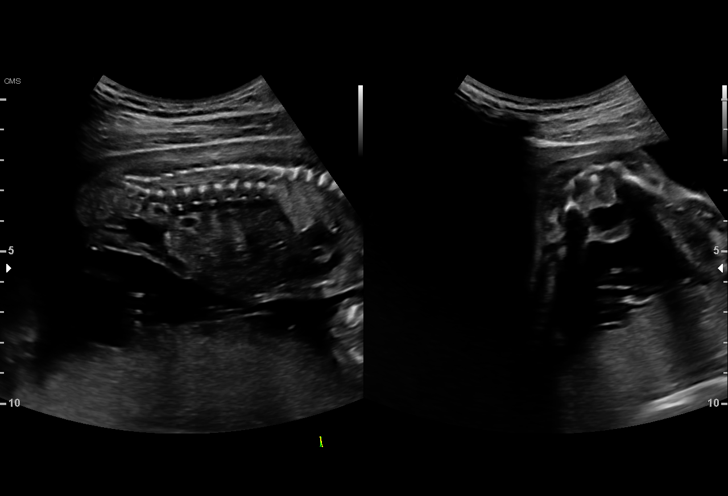
[im 12/100]
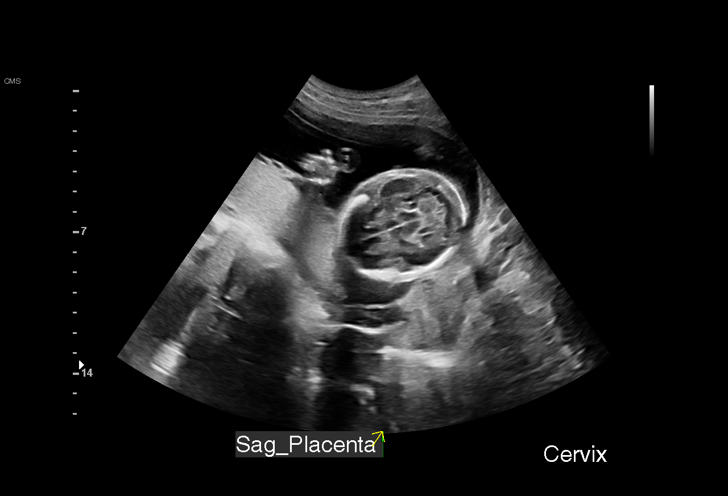
[im 19/100]
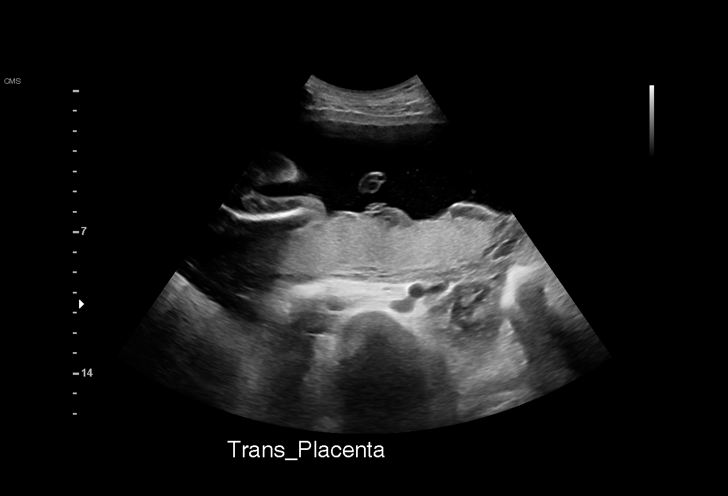
[im 30/100]
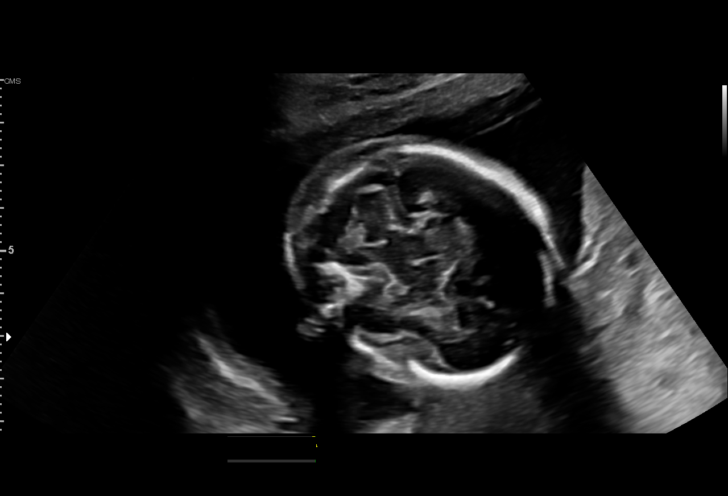
[im 37/100]
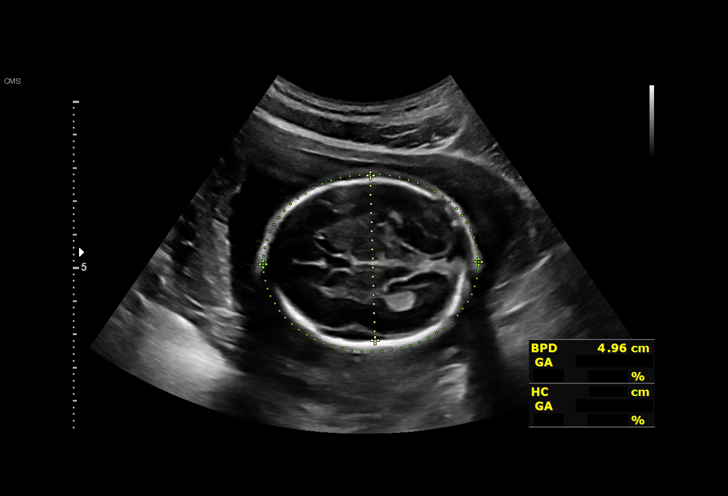
[im 45/100]
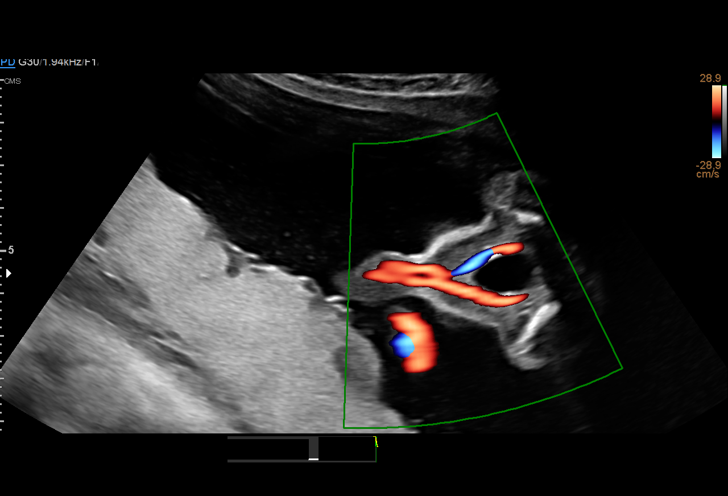
[im 56/100]
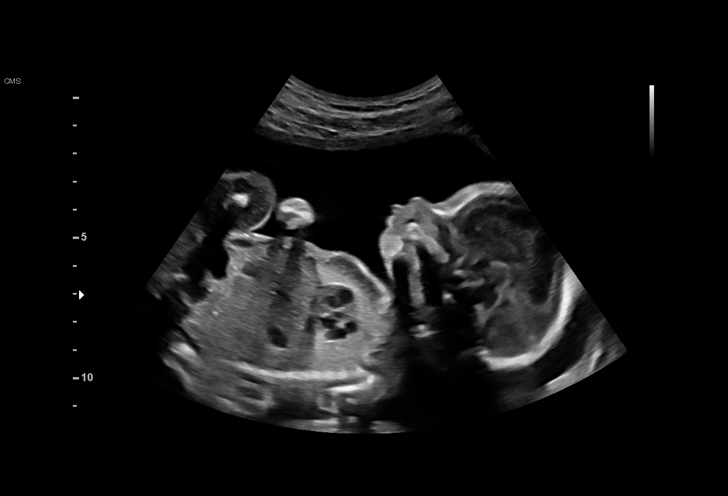
[im 63/100]
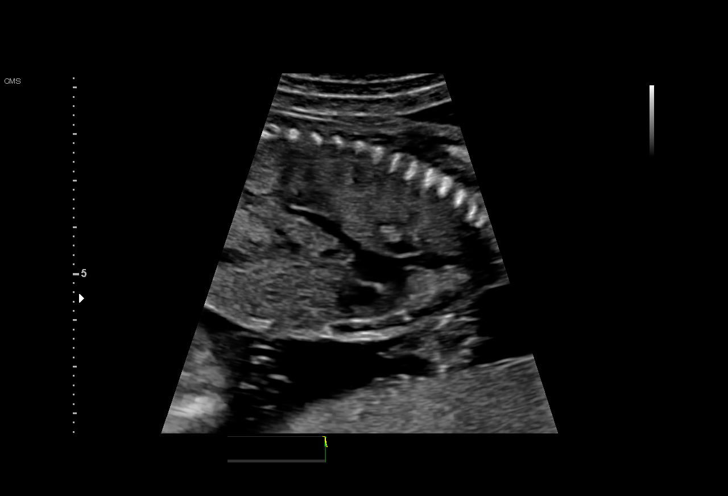
[im 70/100]
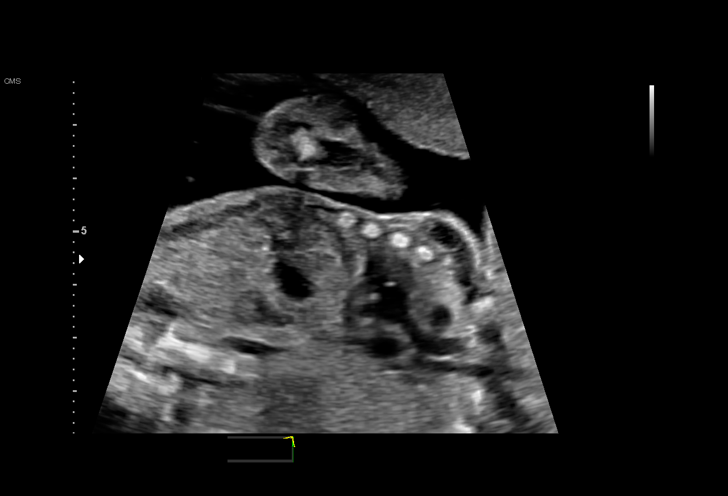
[im 81/100]
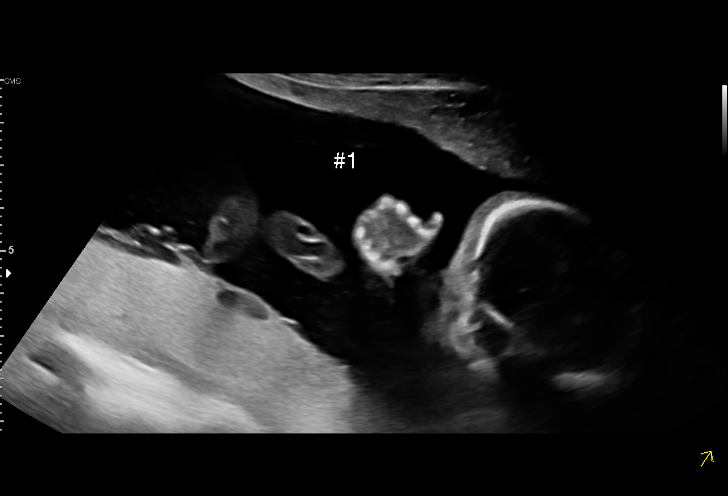
[im 89/100]
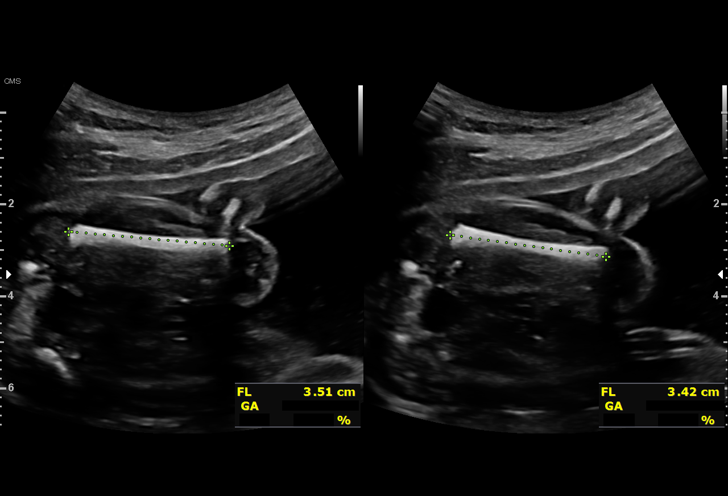
[im 96/100]
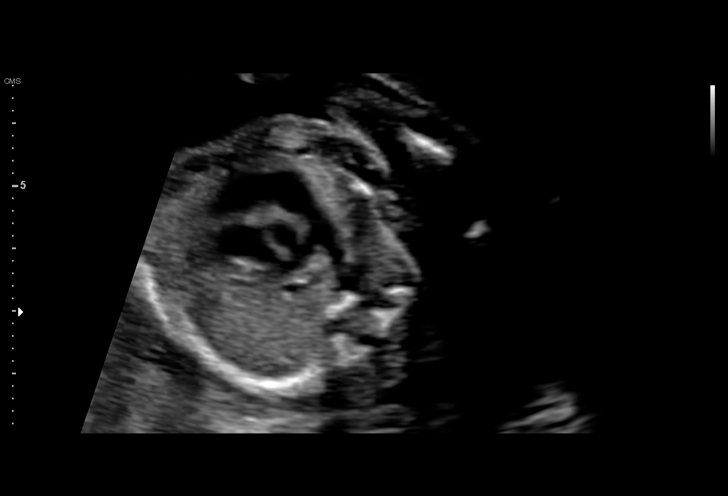

[12 of 28 positions shown; findings below may reference images not displayed]

Indications

 Echogenic intracardiac focus of the heart      [2U]
 (EIF)
 Genetic carrier (silent LANI)            [2U]
 21 weeks gestation of pregnancy
 Antenatal screening for malformations          [2U]
 Low risk NIPS
Fetal Evaluation

 Num Of Fetuses:         1
 Fetal Heart Rate(bpm):  155
 Cardiac Activity:       Observed
 Presentation:           Cephalic
 Placenta:               Posterior
 P. Cord Insertion:      Visualized

 Amniotic Fluid
 AFI FV:      Within normal limits

                             Largest Pocket(cm)

Biometry

 BPD:      49.4  mm     G. Age:  21w 0d         29  %    CI:        70.23   %    70 - 86
                                                         FL/HC:      18.3   %    15.9 -
 HC:       188   mm     G. Age:  21w 1d         26  %    HC/AC:      1.14        1.06 -
 AC:      165.1  mm     G. Age:  21w 4d         47  %    FL/BPD:     69.6   %
 FL:       34.4  mm     G. Age:  20w 6d         21  %    FL/AC:      20.8   %    20 - 24
 HUM:      33.2  mm     G. Age:  21w 1d         43  %
 CER:      21.5  mm     G. Age:  20w 2d         29  %

 LV:        4.8  mm
 CM:        4.9  mm

 Est. FW:     406  gm    0 lb 14 oz      32  %
OB History

 Gravidity:    1         Term:   0        Prem:   0        SAB:   0
 TOP:          0       Ectopic:  0        Living: 0
Gestational Age

 LMP:           21w 0d        Date:  [DATE]                 EDD:   [DATE]
 U/S Today:     21w 1d                                        EDD:   [DATE]
 Best:          21w 3d     Det. By:  U/S C R L  ([DATE])    EDD:   [DATE]
Anatomy

 Cranium:               Appears normal         Aortic Arch:            Appears normal
 Cavum:                 Appears normal         Ductal Arch:            Appears normal
 Ventricles:            Appears normal         Diaphragm:              Appears normal
 Choroid Plexus:        Appears normal         Stomach:                Appears normal, left
                                                                       sided
 Cerebellum:            Appears normal         Abdomen:                Appears normal
 Posterior Fossa:       Appears normal         Abdominal Wall:         Appears nml (cord
                                                                       insert, abd wall)
 Nuchal Fold:           Not applicable (>20    Cord Vessels:           Appears normal (3
                        wks GA)                                        vessel cord)
 Face:                  Appears normal         Kidneys:                Appear normal
                        (orbits and profile)
 Lips:                  Appears normal         Bladder:                Appears normal
 Thoracic:              Appears normal         Spine:                  Appears normal
 Heart:                 Appears normal; EIF    Upper Extremities:      Appears normal
 RVOT:                  Appears normal         Lower Extremities:      Appears normal
 LVOT:                  Appears normal
Targeted Anatomy

 Central Nervous System
 Cereb./Vermis:         Appears normal

 Head/Neck
 Nasal Bone:            Present                Mandible:               Appears normal
 Orbits/Eyes:           Nml w/ lenses          Maxilla:                Appears normal

 Thorax
 SVC:                   Appears normal         3 V Trachea View:       Appears normal
 3 Vessel View:         Appears normal         IVC:                    Appears normal

 Extremities
 Lt Humerus:            Appears normal         Lt Femur:               Appears normal
 Rt Humerus:            Appears normal         Rt Femur:               Appears normal
 Lt Forearm:            Appears normal         Lt Lower Leg:           Appears normal
 Rt Forearm:            Appears normal         Rt Lower Leg:           Appears normal
 Lt Hand:               Open hand nml          Lt Foot:                Nml heel/foot
 Rt Hand:               Open hand nml          Rt Foot:                Nml heel/foot
 Other
 Genitalia:             Normal Female
Cervix Uterus Adnexa

 Cervix
 Length:           3.29  cm.
 Normal appearance by transabdominal scan.

 Uterus
 No abnormality visualized.

 Right Ovary
 Within normal limits.

 Left Ovary
 Within normal limits.

 Cul De Sac
 No free fluid seen.

 Adnexa
 No abnormality visualized.
Impression

 G1 P0. Patient is here for fetal anatomy scan.
 On cell-free fetal DNA screening, the risks of fetal
 aneuploidies are not increased .

 We performed fetal anatomy scan. An echogenic intracardiac
 focus is seen. No other makers of aneuploidies or fetal
 structural defects are seen. Fetal biometry is consistent with
 her previously-established dates. Amniotic fluid is normal and
 good fetal activity is seen.
 I informed the patient that given that she had LANI for fetal
 aneuploidies on cell-free fetal DNA screening, finding of
 echogenic intracardiac focus should be considered a normal
 variant and that the risk of trisomy 21 is not increased. I also
 reassured that echogenic focus does not increase the risk of
 cardiac defects. I also informed her that only amniocentesis
 will give a defintive result on the fetal karyotype.
 Patient opted not to have amniocentesis.

 discussed genetics and transmission.  I encouraged genetic
 counseling and partner screening.  Patient will discuss with a
 partner and decide .

Recommendations

 Follow-up scans as clinically indicated.
                 LANI

## 2020-10-17 NOTE — Progress Notes (Signed)
Subjective:  Joyce Taylor is a 20 y.o. G1P0 at [redacted]w[redacted]d being seen today for ongoing prenatal care.  She is currently monitored for the following issues for this low-risk pregnancy and has Encounter for supervision of normal first pregnancy in first trimester; Alpha thalassemia silent carrier; and Chlamydia trachomatis infection in pregnancy on their problem list.  Patient reports no complaints.  Contractions: Not present. Vag. Bleeding: None.  Movement: Present. Denies leaking of fluid.   The following portions of the patient's history were reviewed and updated as appropriate: allergies, current medications, past family history, past medical history, past social history, past surgical history and problem list. Problem list updated.  Objective:   Vitals:   10/17/20 1530  BP: 123/74  Pulse: 78  Weight: 142 lb (64.4 kg)    Fetal Status: Fetal Heart Rate (bpm): 152   Movement: Present     General:  Alert, oriented and cooperative. Patient is in no acute distress.  Skin: Skin is warm and dry. No rash noted.   Cardiovascular: Normal heart rate noted  Respiratory: Normal respiratory effort, no problems with respiration noted  Abdomen: Soft, gravid, appropriate for gestational age. Pain/Pressure: Absent     Pelvic: Vag. Bleeding: None     Cervical exam deferred        Extremities: Normal range of motion.  Edema: None  Mental Status: Normal mood and affect. Normal behavior. Normal judgment and thought content.   Urinalysis:      Assessment and Plan:  Pregnancy: G1P0 at [redacted]w[redacted]d  1. Encounter for supervision of normal first pregnancy in first trimester  2. [redacted] weeks gestation of pregnancy  Preterm labor symptoms and general obstetric precautions including but not limited to vaginal bleeding, contractions, leaking of fluid and fetal movement were reviewed in detail with the patient. I discussed the assessment and treatment plan with the patient. The patient was provided an opportunity to ask  questions and all were answered. The patient agreed with the plan and demonstrated an understanding of the instructions. The patient was advised to call back or seek an in-person office evaluation/go to [facility] at [facility] for any urgent or concerning symptoms. Please refer to After Visit Summary for other counseling recommendations.  Return in about 6 weeks (around 11/28/2020) for in-person LOB/APP OK/GTT/labs.   Siaosi Alter, Odie Sera, NP

## 2020-10-17 NOTE — Patient Instructions (Addendum)
Maternity Assessment Unit (MAU)  The Maternity Assessment Unit (MAU) is located at the [facility]. The address is: [address], [place], [place], [place] [address]. Please see map below for additional directions.    The Maternity Assessment Unit is designed to help you during your pregnancy, and for up to 6 weeks after delivery, with any pregnancy- or postpartum-related emergencies, if you think you are in labor, or if your water has broken. For example, if you experience nausea and vomiting, vaginal bleeding, severe abdominal or pelvic pain, elevated blood pressure or other problems related to your pregnancy or postpartum time, please come to the Maternity Assessment Unit for assistance.       Second Trimester of Pregnancy  The second trimester of pregnancy is from week 13 through week 27. This is months 4 through 6 of pregnancy. The second trimester is often a time when you feel your best. Your body has adjusted to being pregnant, and you begin to feelbetter physically. During the second trimester: Morning sickness has lessened or stopped completely. You may have more energy. You may have an increase in appetite. The second trimester is also a time when the unborn baby (fetus) is growing rapidly. At the end of the sixth month, the fetus may be up to 12 inches long and weigh about 1 pounds. You will likely begin to feel the baby move (quickening) between 16 and 20 weeks of pregnancy. Body changes during your second trimester Your body continues to go through many changes during your second trimester.The changes vary and generally return to normal after the baby is born. Physical changes Your weight will continue to increase. You will notice your lower abdomen bulging out. You may begin to get stretch marks on your hips, abdomen, and breasts. Your breasts will continue to grow and to become tender. Dark spots or blotches (chloasma or  mask of pregnancy) may develop on your face. A dark line from your belly button to the pubic area (linea nigra) may appear. You may have changes in your hair. These can include thickening of your hair, rapid growth, and changes in texture. Some people also have hair loss during or after pregnancy, or hair that feels dry or thin. Health changes You may develop headaches. You may have heartburn. You may develop constipation. You may develop hemorrhoids or swollen, bulging veins (varicose veins). Your gums may bleed and may be sensitive to brushing and flossing. You may urinate more often because the fetus is pressing on your bladder. You may have back pain. This is caused by: Weight gain. Pregnancy hormones that are relaxing the joints in your pelvis. A shift in weight and the muscles that support your balance. Follow these instructions at home: Medicines Follow your health care provider's instructions regarding medicine use. Specific medicines may be either safe or unsafe to take during pregnancy. Do not take any medicines unless approved by your health care provider. Take a prenatal vitamin that contains at least 600 micrograms (mcg) of folic acid. Eating and drinking Eat a healthy diet that includes fresh fruits and vegetables, whole grains, good sources of protein such as meat, eggs, or tofu, and low-fat dairy products. Avoid raw meat and unpasteurized juice, milk, and cheese. These carry germs that can harm you and your baby. You may need to take these actions to prevent or treat constipation: Drink enough fluid to keep your urine pale yellow. Eat foods that are high in fiber, such as  beans, whole grains, and fresh fruits and vegetables. Limit foods that are high in fat and processed sugars, such as fried or sweet foods. Activity Exercise only as directed by your health care provider. Most people can continue their usual exercise routine during pregnancy. Try to exercise for 30 minutes  at least 5 days a week. Stop exercising if you develop contractions in your uterus. Stop exercising if you develop pain or cramping in the lower abdomen or lower back. Avoid exercising if it is very hot or humid or if you are at a high altitude. Avoid heavy lifting. If you choose to, you may have sex unless your health care provider tells you not to. Relieving pain and discomfort Wear a supportive bra to prevent discomfort from breast tenderness. Take warm sitz baths to soothe any pain or discomfort caused by hemorrhoids. Use hemorrhoid cream if your health care provider approves. Rest with your legs raised (elevated) if you have leg cramps or low back pain. If you develop varicose veins: Wear support hose as told by your health care provider. Elevate your feet for 15 minutes, 3-4 times a day. Limit salt in your diet. Safety Wear your seat belt at all times when driving or riding in a car. Talk with your health care provider if someone is verbally or physically abusive to you. Lifestyle Do not use hot tubs, steam rooms, or saunas. Do not douche. Do not use tampons or scented sanitary pads. Avoid cat litter boxes and soil used by cats. These carry germs that can cause birth defects in the baby and possibly loss of the fetus by miscarriage or stillbirth. Do not use herbal remedies, alcohol, illegal drugs, or medicines that are not approved by your health care provider. Chemicals in these products can harm your baby. Do not use any products that contain nicotine or tobacco, such as cigarettes, e-cigarettes, and chewing tobacco. If you need help quitting, ask your health care provider. General instructions During a routine prenatal visit, your health care provider will do a physical exam and other tests. He or she will also discuss your overall health. Keep all follow-up visits. This is important. Ask your health care provider for a referral to a local prenatal education class. Ask for help if  you have counseling or nutritional needs during pregnancy. Your health care provider can offer advice or refer you to specialists for help with various needs. Where to find more information American Pregnancy Association: americanpregnancy.org [employer]: https://www.todd-brady.net/ Office on [employer] Health: MightyReward.co.nz Contact a health care provider if you have: A headache that does not go away when you take medicine. Vision changes or you see spots in front of your eyes. Mild pelvic cramps, pelvic pressure, or nagging pain in the abdominal area. Persistent nausea, vomiting, or diarrhea. A bad-smelling vaginal discharge or foul-smelling urine. Pain when you urinate. Sudden or extreme swelling of your face, hands, ankles, feet, or legs. A fever. Get help right away if you: Have fluid leaking from your vagina. Have spotting or bleeding from your vagina. Have severe abdominal cramping or pain. Have difficulty breathing. Have chest pain. Have fainting spells. Have not felt your baby move for the time period told by your health care provider. Have new or increased pain, swelling, or redness in an arm or leg. Summary The second trimester of pregnancy is from week 13 through week 27 (months 4 through 6). Do not use herbal remedies, alcohol, illegal drugs, or medicines that are not approved  by your health care provider. Chemicals in these products can harm your baby. Exercise only as directed by your health care provider. Most people can continue their usual exercise routine during pregnancy. Keep all follow-up visits. This is important. This information is not intended to replace advice given to you by your health care provider. Make sure you discuss any questions you have with your healthcare provider. Document Revised: 08/03/2019 Document Reviewed: 06/09/2019 Elsevier Patient Education  2022 Elsevier  Inc.       Preterm Labor The normal length of a pregnancy is 39-41 weeks. Preterm labor is when labor starts before 37 completed weeks of pregnancy. Babies who are born prematurely and survive may not be fully developed and may be at an increased risk for long-term problems such as cerebral palsy, developmental delays, and vision andhearing problems. Babies who are born too early may have problems soon after birth. Premature babies may have problems regulating blood sugar, body temperature, heart rate, and breathing rate. These babies often have trouble with feeding. The risk ofhaving problems is highest for babies who are born before 34 weeks of pregnancy. What are the causes? The exact cause of this condition is not known. What increases the risk? You are more likely to have preterm labor if you have certain risk factors that relate to your medical history, problems with present and past pregnancies, andlifestyle factors. Medical history You have abnormalities of the uterus, including a short cervix. You have STIs (sexually transmitted infections) or other infections of the urinary tract and the vagina. You have chronic illnesses, such as blood clotting problems, diabetes, or high blood pressure. You are overweight or underweight. Present and past pregnancies You have had preterm labor before. You are pregnant with twins or other multiples. You have been diagnosed with a condition in which the placenta covers your cervix (placenta previa). You waited less than 18 months between giving birth and becoming pregnant again. Your unborn baby has some abnormalities. You have vaginal bleeding during pregnancy. You became pregnant through in vitro fertilization (IVF). Lifestyle and environmental factors You use tobacco products or drink alcohol. You use drugs. You have stress and no social support. You experience domestic violence. You are exposed to certain chemicals or environmental  pollutants. Other factors You are younger than age 73 or older than age 84. What are the signs or symptoms? Symptoms of this condition include: Cramps similar to those that can happen during a menstrual period. The cramps may happen with diarrhea. Pain in the abdomen or lower back. Regular contractions that may feel like tightening of the abdomen. A feeling of increased pressure in the pelvis. Increased watery or bloody mucus discharge from the vagina. Water breaking (ruptured amniotic sac). How is this diagnosed? This condition is diagnosed based on: Your medical history and a physical exam. A pelvic exam. An ultrasound. Monitoring your uterus for contractions. Other tests, including: A swab of the cervix to check for a chemical called fetal fibronectin. Urine tests. How is this treated? Treatment for this condition depends on the length of your pregnancy, your condition, and the health of your baby. Treatment may include: Taking medicines, such as: Hormone medicines. These may be given early in pregnancy to help support the pregnancy. Medicines to stop contractions. Medicines to help mature the baby's lungs. These may be prescribed if the risk of delivery is high. Medicines to help protect your baby from brain and nerve complications such as cerebral palsy. Bed rest. If the labor happens before 34  weeks of pregnancy, you may need to stay in the hospital. Delivery of the baby. Follow these instructions at home:  Do not use any products that contain nicotine or tobacco. These products include cigarettes, chewing tobacco, and vaping devices, such as e-cigarettes. If you need help quitting, ask your health care provider. Do not drink alcohol. Take over-the-counter and prescription medicines only as told by your health care provider. Rest as told by your health care provider. Return to your normal activities as told by your health care provider. Ask your health care provider what  activities are safe for you. Keep all follow-up visits. This is important. How is this prevented? To increase your chance of having a full-term pregnancy: Do not use drugs or take medicines that have not been prescribed to you during your pregnancy. Talk with your health care provider before taking any herbal supplements, even if you have been taking them regularly. Make sure you gain a healthy amount of weight during your pregnancy. Watch for infection. If you think that you might have an infection, get it checked right away. Symptoms of infection may include: Fever. Abnormal vaginal discharge or discharge that smells bad. Pain or burning with urination. Needing to urinate urgently. Frequently urinating or passing small amounts of urine frequently. Blood in your urine or urine that smells bad or unusual. Where to find more information U.S. Department of Health and [employer]istHuman Services Office on Women's Health: http://hoffman.com/www.womenshealth.gov [employer]ians and Gynecologists: www.acog.org Centers for Disease Control and Prevention, Preterm Birth: FootballExhibition.com.brwww.cdc.gov Contact a health care provider if: You think you are going into preterm labor. You have signs or symptoms of preterm labor. You have symptoms of infection. Get help right away if: You are having regular, painful contractions every 5 minutes or less. Your water breaks. Summary Preterm labor is labor that starts before you reach 37 weeks of pregnancy. Delivering your baby early increases your baby's risk of developing long-term problems. You are more likely to have preterm labor if you have certain risk factors that relate to your medical history, problems with present and past pregnancies, and lifestyle factors. Keep all follow-up visits. This is important. Contact a health care provider if you have signs or symptoms of preterm labor. This information is not intended to replace advice given to you by your health care provider.  Make sure you discuss any questions you have with your healthcare provider. Document Revised: 02/28/2020 Document Reviewed: 02/28/2020 Elsevier Patient Education  2022 Elsevier Inc.       Oral Glucose Tolerance Test During Pregnancy Why am I having this test? The oral glucose tolerance test (OGTT) is done to check how your body processes blood sugar (glucose). This is one of several tests used to diagnose diabetes that develops during pregnancy (gestational diabetes mellitus). Gestational diabetes is a short-term form of diabetes that some women develop while they are pregnant. It usually occurs during the second trimesterof pregnancy and goes away after delivery. Testing, or screening, for gestational diabetes usually occurs at weeks 24-28 of pregnancy. You may have the OGTT test after having a 1-hour glucose screening test if the results from that test indicate that you may have gestational diabetes. This test may also be needed if: You have a history of gestational diabetes. There is a history of giving birth to very large babies or of losing pregnancies (having stillbirths). You have signs and symptoms of diabetes, such as: Changes in your eyesight. Tingling or numbness in your hands or feet.  Changes in hunger, thirst, and urination, and these are not explained by your pregnancy. What is being tested? This test measures the amount of glucose in your blood at different timesduring a period of 3 hours. This shows how well your body can process glucose. What kind of sample is taken?  Blood samples are required for this test. They are usually collected byinserting a needle into a blood vessel. How do I prepare for this test? For 3 days before your test, eat normally. Have plenty of carbohydrate-rich foods. Follow instructions from your health care provider about: Eating or drinking restrictions on the day of the test. You may be asked not to eat or drink anything other than water (to  fast) starting 8-10 hours before the test. Changing or stopping your regular medicines. Some medicines may interfere with this test. Tell a health care provider about: All medicines you are taking, including vitamins, herbs, eye drops, creams, and over-the-counter medicines. Any blood disorders you have. Any surgeries you have had. Any medical conditions you have. What happens during the test? First, your blood glucose will be measured. This is referred to as your fasting blood glucose because you fasted before the test. Then, you will drink a glucose solution that contains a certain amount of glucose. Your blood glucosewill be measured again 1, 2, and 3 hours after you drink the solution. This test takes about 3 hours to complete. You will need to stay at the testing location during this time. During the testing period: Do not eat or drink anything other than the glucose solution. Do not exercise. Do not use any products that contain nicotine or tobacco, such as cigarettes, e-cigarettes, and chewing tobacco. These can affect your test results. If you need help quitting, ask your health care provider. The testing procedure may vary among health care providers and hospitals. How are the results reported? Your results will be reported as milligrams of glucose per deciliter of blood (mg/dL) or millimoles per liter (mmol/L). There is more than one source for screening and diagnosis reference values used to diagnose gestational diabetes. Your health care provider will compare your results to normal values that were established after testing a large group of people (reference values). Reference values may vary among labs and hospitals. For this test (Carpenter-Coustan), reference values are: Fasting: 95 mg/dL (5.3 mmol/L). 1 hour: 180 mg/dL (32.3 mmol/L). 2 hour: 155 mg/dL (8.6 mmol/L). 3 hour: 140 mg/dL (7.8 mmol/L). What do the results mean? Results below the reference values are considered normal.  If two or more of your blood glucose levels are at or above the reference values, you may be diagnosed with gestational diabetes. If only one level is high, your healthcare provider may suggest repeat testing or other tests to confirm a diagnosis. Talk with your health care provider about what your results mean. Questions to ask your health care provider Ask your health care provider, or the department that is doing the test: When will my results be ready? How will I get my results? What are my treatment options? What other tests do I need? What are my next steps? Summary The oral glucose tolerance test (OGTT) is one of several tests used to diagnose diabetes that develops during pregnancy (gestational diabetes mellitus). Gestational diabetes is a short-term form of diabetes that some women develop while they are pregnant. You may have the OGTT test after having a 1-hour glucose screening test if the results from that test show that you may have gestational diabetes.  You may also have this test if you have any symptoms or risk factors for this type of diabetes. Talk with your health care provider about what your results mean. This information is not intended to replace advice given to you by your health care provider. Make sure you discuss any questions you have with your healthcare provider. Document Revised: 08/04/2019 Document Reviewed: 08/04/2019 Elsevier Patient Education  2022 Elsevier Inc.       Round Ligament Pain  The round ligament is a cord of muscle and tissue that helps support the uterus. It can become a source of pain during pregnancy if it becomes stretched or twisted as the baby grows. The pain usually begins in the second trimester (13-28 weeks) of pregnancy, and it can come and go until the baby is delivered.It is not a serious problem, and it does not cause harm to the baby. Round ligament pain is usually a short, sharp, and pinching pain, but it can also be a dull,  lingering, and aching pain. The pain is felt in the lower side of the abdomen or in the groin. It usually starts deep in the groin and moves up to the outside of the hip area. The pain may occur when you: Suddenly change position, such as quickly going from a sitting to standing position. Roll over in bed. Cough or sneeze. Do physical activity. Follow these instructions at home:  Watch your condition for any changes. When the pain starts, relax. Then try any of these methods to help with the pain: Sitting down. Flexing your knees up to your abdomen. Lying on your side with one pillow under your abdomen and another pillow between your legs. Sitting in a warm bath for 15-20 minutes or until the pain goes away. Take over-the-counter and prescription medicines only as told by your health care provider. Move slowly when you sit down or stand up. Avoid long walks if they cause pain. Stop or reduce your physical activities if they cause pain. Keep all follow-up visits as told by your health care provider. This is important. Contact a health care provider if: Your pain does not go away with treatment. You feel pain in your back that you did not have before. Your medicine is not helping. Get help right away if: You have a fever or chills. You develop uterine contractions. You have vaginal bleeding. You have nausea or vomiting. You have diarrhea. You have pain when you urinate. Summary Round ligament pain is felt in the lower abdomen or groin. It is usually a short, sharp, and pinching pain. It can also be a dull, lingering, and aching pain. This pain usually begins in the second trimester (13-28 weeks). It occurs because the uterus is stretching with the growing baby, and it is not harmful to the baby. You may notice the pain when you suddenly change position, when you cough or sneeze, or during physical activity. Relaxing, flexing your knees to your abdomen, lying on one side, or taking a  warm bath may help to get rid of the pain. Get help from your health care provider if the pain does not go away or if you have vaginal bleeding, nausea, vomiting, diarrhea, or painful urination. This information is not intended to replace advice given to you by your health care provider. Make sure you discuss any questions you have with your healthcare provider. Document Revised: 02/10/2020 Document Reviewed: 08/12/2017 Elsevier Patient Education  2022 [employer].

## 2020-10-23 ENCOUNTER — Ambulatory Visit: Payer: 59 | Admitting: Advanced Practice Midwife

## 2020-10-23 ENCOUNTER — Ambulatory Visit: Payer: 59

## 2020-11-06 ENCOUNTER — Other Ambulatory Visit: Payer: Self-pay

## 2020-11-06 ENCOUNTER — Ambulatory Visit: Payer: 59 | Attending: Women's Health

## 2020-11-06 DIAGNOSIS — D563 Thalassemia minor: Secondary | ICD-10-CM | POA: Diagnosis not present

## 2020-11-06 NOTE — Progress Notes (Signed)
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  She was seen today with her partner, Joyce Taylor.  To review, Joyce Taylor had [employer] Basic carrier screening performed through [place]. The results of the screen identified her as a silent carrier for alpha-thalassemia (aa/a-). Alpha-thalassemia is different in its inheritance compared to other hemoglobinopathies as there are two copies of two alpha globin genes (HBA1 and HBA2) on each chromosome 16, or four alpha globin genes total (aa/aa). A person can be a carrier of one alpha gene mutation (aa/a-), also referred to as a "silent carrier". A person who carries two alpha globin gene mutations can either carry them in cis (both on the same chromosome, denoted as aa/--) or in trans (on different chromosomes, denoted as a-/a-). Alpha-thalassemia carriers of two mutations who have African American ancestry are more likely to have a trans arrangement (a-/a-); cis configuration is reported to be rare in individuals with African American ancestry.     There are several different forms of alpha-thalassemia. The most severe form of alpha-thalassemia, Hb Barts, is associated with an absence of alpha globin chain synthesis as a result of deletions of all four alpha globin genes (--/--).  Given that Joyce Taylor is a silent carrier (aa/a-), her pregnancies would not be at increased risk for Hb Barts, even if her partner is a carrier for alpha-thalassemia, as she will always pass on at least one copy of the alpha globin gene to her children. Hemoglobin H (HbH) disease is caused by three deleted or dysfunctioning alpha globin alleles (a-/--) and is characterized by microcytic hypochromic hemolytic anemia, hepatosplenomegaly, mild jaundice, growth retardation, and sometimes thalassemia-like bone changes. Given Joyce Taylor's silent carrier status  (aa/a-), the current fetus would only be at risk for HbH disease (a-/--), if her partner is a carrier for two alpha globin mutations in cis (aa/--). If this is the case, the risk for HbH disease in the pregnancy would be 1 in 4 (25%). However, if Joyce Taylor's partner is a carrier for two alpha globin mutations, he would be more likely to carry them in trans configuration (a-/a-) than the cis configuration (aa/--), given his ethnicity. If he is a carrier of alpha-thalassemia in trans, then the pregnancy would not be at increased risk for HbH disease and would at most be a carrier of two gene changes in trans (a-/a-).  Carriers may have mild anemia, or small red blood cells (low MCV on CBC), but are expected to be healthy. Based on the carrier frequency for alpha-thalassemia in the African American population, Joyce Taylor partner has a 1 in 30 chance of being any type of carrier for alpha-thalassemia.    Screening for the other conditions in the panel, Cystic fibrosis, Spinal Muscular Atrophy and Beta globin gene changes (including sickle cell trait) were negative.  Panorama screening for chromosome conditions was also negative. These results greatly reduce, but cannot eliminate, the chance to have a child with any of these conditions.  We also obtained a detailed family history and pregnancy history. This is the first pregnancy for this couple. The patient reported no complications or exposures that would be expected to increase the risk for birth defects.  She reported that her mother passed away due to breast cancer at 21 years old and that she and her sister were told that there was no "genetic cause".  We encouraged her to make her doctor aware of this history and follow guidelines for self exams and mammography.  We are happy to review records on her mother's genetic testing or send her for a consultation with a cancer genetic counselor if desired.  The only other cancer history in the family was her maternal  grandmother's sister who also had breast cancer, but we do not known at what age.  The remainder of the family history was unremarkable for birth defects, developmental differences, recurrent pregnancy loss or known genetic conditions.  We appreciated being involved in the care of this patient and can be reached at [phone].  Plan of care: Carrier testing for Joyce Taylor (dob 06/05/2000) drawn today via saliva sample for HBA2 gene.  He declined screening for additional conditions.  Results will be called to him at [phone] in approximately 2 weeks. Once his results are available, we can review testing options for the pregnancy, however, the patient indicated that she was not interested in amniocentesis.  Cherly Anderson, MS, CGC

## 2020-11-28 ENCOUNTER — Other Ambulatory Visit (HOSPITAL_COMMUNITY)
Admission: RE | Admit: 2020-11-28 | Discharge: 2020-11-28 | Disposition: A | Discharge status: Home / Self Care | Payer: 59 | Source: Ambulatory Visit | Attending: Women's Health | Admitting: Women's Health

## 2020-11-28 ENCOUNTER — Ambulatory Visit (INDEPENDENT_AMBULATORY_CARE_PROVIDER_SITE_OTHER): Payer: 59 | Admitting: Women's Health

## 2020-11-28 ENCOUNTER — Other Ambulatory Visit: Payer: 59

## 2020-11-28 ENCOUNTER — Other Ambulatory Visit: Payer: Self-pay

## 2020-11-28 VITALS — BP 119/71 | HR 89 | Wt 152.0 lb

## 2020-11-28 DIAGNOSIS — A568 Sexually transmitted chlamydial infection of other sites: Secondary | ICD-10-CM | POA: Insufficient documentation

## 2020-11-28 DIAGNOSIS — O98312 Other infections with a predominantly sexual mode of transmission complicating pregnancy, second trimester: Secondary | ICD-10-CM

## 2020-11-28 DIAGNOSIS — Z3A27 27 weeks gestation of pregnancy: Secondary | ICD-10-CM

## 2020-11-28 DIAGNOSIS — Z3401 Encounter for supervision of normal first pregnancy, first trimester: Secondary | ICD-10-CM

## 2020-11-28 NOTE — Patient Instructions (Signed)
Maternity Assessment Unit (MAU)  The Maternity Assessment Unit (MAU) is located at the [facility]. The address is: [address], [address], [place], [place] [address]. Please see map below for additional directions.    The Maternity Assessment Unit is designed to help you during your pregnancy, and for up to 6 weeks after delivery, with any pregnancy- or postpartum-related emergencies, if you think you are in labor, or if your water has broken. For example, if you experience nausea and vomiting, vaginal bleeding, severe abdominal or pelvic pain, elevated blood pressure or other problems related to your pregnancy or postpartum time, please come to the Maternity Assessment Unit for assistance.       Preterm Labor The normal length of a pregnancy is 39-41 weeks. Preterm labor is when labor starts before 37 completed weeks of pregnancy. Babies who are born prematurely and survive may not be fully developed and may be at an increased risk for long-term problems such as cerebral palsy, developmental delays, and vision and hearing problems. Babies who are born too early may have problems soon after birth. Premature babies may have problems regulating blood sugar, body temperature, heart rate, and breathing rate. These babies often have trouble with feeding. The risk of having problems is highest for babies who are born before 34 weeks of pregnancy. What are the causes? The exact cause of this condition is not known. What increases the risk? You are more likely to have preterm labor if you have certain risk factors that relate to your medical history, problems with present and past pregnancies, and lifestyle factors. Medical history You have abnormalities of the uterus, including a short cervix. You have STIs (sexually transmitted infections) or other infections of the urinary tract and the vagina. You have chronic illnesses, such as blood clotting  problems, diabetes, or high blood pressure. You are overweight or underweight. Present and past pregnancies You have had preterm labor before. You are pregnant with twins or other multiples. You have been diagnosed with a condition in which the placenta covers your cervix (placenta previa). You waited less than 18 months between giving birth and becoming pregnant again. Your unborn baby has some abnormalities. You have vaginal bleeding during pregnancy. You became pregnant through in vitro fertilization (IVF). Lifestyle and environmental factors You use tobacco products or drink alcohol. You use drugs. You have stress and no social support. You experience domestic violence. You are exposed to certain chemicals or environmental pollutants. Other factors You are younger than age 17 or older than age 35. What are the signs or symptoms? Symptoms of this condition include: Cramps similar to those that can happen during a menstrual period. The cramps may happen with diarrhea. Pain in the abdomen or lower back. Regular contractions that may feel like tightening of the abdomen. A feeling of increased pressure in the pelvis. Increased watery or bloody mucus discharge from the vagina. Water breaking (ruptured amniotic sac). How is this diagnosed? This condition is diagnosed based on: Your medical history and a physical exam. A pelvic exam. An ultrasound. Monitoring your uterus for contractions. Other tests, including: A swab of the cervix to check for a chemical called fetal fibronectin. Urine tests. How is this treated? Treatment for this condition depends on the length of your pregnancy, your condition, and the health of your baby. Treatment may include: Taking medicines, such as: Hormone medicines. These may be given early in pregnancy to help support the pregnancy. Medicines to stop   contractions. Medicines to help mature the baby's lungs. These may be prescribed if the risk of  delivery is high. Medicines to help protect your baby from brain and nerve complications such as cerebral palsy. Bed rest. If the labor happens before 34 weeks of pregnancy, you may need to stay in the hospital. Delivery of the baby. Follow these instructions at home:  Do not use any products that contain nicotine or tobacco. These products include cigarettes, chewing tobacco, and vaping devices, such as e-cigarettes. If you need help quitting, ask your health care provider. Do not drink alcohol. Take over-the-counter and prescription medicines only as told by your health care provider. Rest as told by your health care provider. Return to your normal activities as told by your health care provider. Ask your health care provider what activities are safe for you. Keep all follow-up visits. This is important. How is this prevented? To increase your chance of having a full-term pregnancy: Do not use drugs or take medicines that have not been prescribed to you during your pregnancy. Talk with your health care provider before taking any herbal supplements, even if you have been taking them regularly. Make sure you gain a healthy amount of weight during your pregnancy. Watch for infection. If you think that you might have an infection, get it checked right away. Symptoms of infection may include: Fever. Abnormal vaginal discharge or discharge that smells bad. Pain or burning with urination. Needing to urinate urgently. Frequently urinating or passing small amounts of urine frequently. Blood in your urine or urine that smells bad or unusual. Where to find more information [place]. Department of Health and [employer], systems on Women's Health: [place]BeachSigns.tn [employer]ians [employer]nec[employer]: www.acog.org Centers for Disease Control and Prevention, Preterm Birth: http://www.wolf.info/ Contact a health care provider if: You think you are going into preterm labor. You have signs  or symptoms of preterm labor. You have symptoms of infection. Get help right away if: You are having regular, painful contractions every 5 minutes or less. Your water breaks. Summary Preterm labor is labor that starts before you reach 37 weeks of pregnancy. Delivering your baby early increases your baby's risk of developing long-term problems. You are more likely to have preterm labor if you have certain risk factors that relate to your medical history, problems with present and past pregnancies, and lifestyle factors. Keep all follow-up visits. This is important. Contact a health care provider if you have signs or symptoms of preterm labor. This information is not intended to replace advice given to you by your health care provider. Make sure you discuss any questions you have with your health care provider. Document Revised: 02/28/2020 Document Reviewed: 02/28/2020 Elsevier Patient Education  2022 [employer] of Pregnancy The third trimester of pregnancy is from week 28 through week 30. This is months 7 through 9. The third trimester is a time when the unborn baby (fetus) is growing rapidly. At the end of the ninth month, the fetus is about 20 inches long and weighs 6-10 pounds. Body changes during your third trimester During the third trimester, your body will continue to go through many changes. The changes vary and generally return to normal after your baby is born. Physical changes Your weight will continue to increase. You can expect to gain 25-35 pounds (11-16 kg) by the end of the pregnancy if you begin pregnancy at a normal weight. If you are underweight, you can  expect to gain 28-40 lb (about 13-18 kg), and if you are overweight, you can expect to gain 15-25 lb (about 7-11 kg). You may begin to get stretch marks on your hips, abdomen, and breasts. Your breasts will continue to grow and may hurt. A yellow fluid (colostrum) may leak from your breasts. This  is the first milk you are producing for your baby. You may have changes in your hair. These can include thickening of your hair, rapid growth, and changes in texture. Some people also have hair loss during or after pregnancy, or hair that feels dry or thin. Your belly button may stick out. You may notice more swelling in your hands, face, or ankles. Health changes You may have heartburn. You may have constipation. You may develop hemorrhoids. You may develop swollen, bulging veins (varicose veins) in your legs. You may have increased body aches in the pelvis, back, or thighs. This is due to weight gain and increased hormones that are relaxing your joints. You may have increased tingling or numbness in your hands, arms, and legs. The skin on your abdomen may also feel numb. You may feel short of breath because of your expanding uterus. Other changes You may urinate more often because the fetus is moving lower into your pelvis and pressing on your bladder. You may have more problems sleeping. This may be caused by the size of your abdomen, an increased need to urinate, and an increase in your body's metabolism. You may notice the fetus "dropping," or moving lower in your abdomen (lightening). You may have increased vaginal discharge. You may notice that you have pain around your pelvic bone as your uterus distends. Follow these instructions at home: Medicines Follow your health care provider's instructions regarding medicine use. Specific medicines may be either safe or unsafe to take during pregnancy. Do not take any medicines unless approved by your health care provider. Take a prenatal vitamin that contains at least 600 micrograms (mcg) of folic acid. Eating and drinking Eat a healthy diet that includes fresh fruits and vegetables, whole grains, good sources of protein such as meat, eggs, or tofu, and low-fat dairy products. Avoid raw meat and unpasteurized juice, milk, and cheese. These  carry germs that can harm you and your baby. Eat 4 or 5 small meals rather than 3 large meals a day. You may need to take these actions to prevent or treat constipation: Drink enough fluid to keep your urine pale yellow. Eat foods that are high in fiber, such as beans, whole grains, and fresh fruits and vegetables. Limit foods that are high in fat and processed sugars, such as fried or sweet foods. Activity Exercise only as directed by your health care provider. Most people can continue their usual exercise routine during pregnancy. Try to exercise for 30 minutes at least 5 days a week. Stop exercising if you experience contractions in the uterus. Stop exercising if you develop pain or cramping in the lower abdomen or lower back. Avoid heavy lifting. Do not exercise if it is very hot or humid or if you are at a high altitude. If you choose to, you may continue to have sex unless your health care provider tells you not to. Relieving pain and discomfort Take frequent breaks and rest with your legs raised (elevated) if you have leg cramps or low back pain. Take warm sitz baths to soothe any pain or discomfort caused by hemorrhoids. Use hemorrhoid cream if your health care provider approves. Wear a supportive  bra to prevent discomfort from breast tenderness. If you develop varicose veins: Wear support hose as told by your health care provider. Elevate your feet for 15 minutes, 3-4 times a day. Limit salt in your diet. Safety Talk to your health care provider before traveling far distances. Do not use hot tubs, steam rooms, or saunas. Wear your seat belt at all times when driving or riding in a car. Talk with your health care provider if someone is verbally or physically abusive to you. Preparing for birth To prepare for the arrival of your baby: Take prenatal classes to understand, practice, and ask questions about labor and delivery. Visit the hospital and tour the maternity area. Purchase  a rear-facing car seat and make sure you know how to install it in your car. Prepare the baby's room or sleeping area. Make sure to remove all pillows and stuffed animals from the baby's crib to prevent suffocation. General instructions Avoid cat litter boxes and soil used by cats. These carry germs that can cause birth defects in the baby. If you have a cat, ask someone to clean the litter box for you. Do not douche or use tampons. Do not use scented sanitary pads. Do not use any products that contain nicotine or tobacco, such as cigarettes, e-cigarettes, and chewing tobacco. If you need help quitting, ask your health care provider. Do not use any herbal remedies, illegal drugs, or medicines that were not prescribed to you. Chemicals in these products can harm your baby. Do not drink alcohol. You will have more frequent prenatal exams during the third trimester. During a routine prenatal visit, your health care provider will do a physical exam, perform tests, and discuss your overall health. Keep all follow-up visits. This is important. Where to find more information American Pregnancy Association: americanpregnancy.org [employer] of Obstetricians and Gynecologists: https://www.todd-brady.net/ Office on [employer] Health: MightyReward.co.nz Contact a health care provider if you have: A fever. Mild pelvic cramps, pelvic pressure, or nagging pain in your abdominal area or lower back. Vomiting or diarrhea. Bad-smelling vaginal discharge or foul-smelling urine. Pain when you urinate. A headache that does not go away when you take medicine. Visual changes or see spots in front of your eyes. Get help right away if: Your water breaks. You have regular contractions less than 5 minutes apart. You have spotting or bleeding from your vagina. You have severe abdominal pain. You have difficulty breathing. You have chest pain. You have fainting spells. You have not felt your baby  move for the time period told by your health care provider. You have new or increased pain, swelling, or redness in an arm or leg. Summary The third trimester of pregnancy is from week 28 through week 40 (months 7 through 9). You may have more problems sleeping. This can be caused by the size of your abdomen, an increased need to urinate, and an increase in your body's metabolism. You will have more frequent prenatal exams during the third trimester. Keep all follow-up visits. This is important. This information is not intended to replace advice given to you by your health care provider. Make sure you discuss any questions you have with your health care provider. Document Revised: 08/03/2019 Document Reviewed: 06/09/2019 Elsevier Patient Education  2022 Elsevier Inc.       Pregnancy and Influenza Influenza, also called the flu, is an infection of the lungs and airways (respiratory tract). If you are pregnant, you are more likely to catch the flu. You are also more  likely to have serious illness from the flu. This is because pregnancy causes changes to your body's disease-fighting system (immune system), heart, and lungs. If you develop serious illness from the flu, this can cause problems for you and your developing baby. How do people get the flu? The flu is caused by a type of germ called a virus. It spreads when virus particles get passed from person to person by: Being near a sick person who is coughing or sneezing. Touching something that has the virus on it and then touching your mouth, nose, or face. The influenza virus is most common during the fall and winter. What actions can I take to protect myself against the flu?  Get a flu shot. The best way to prevent the flu is to get a flu shot before flu season starts. The flu shot is not dangerous for your developing baby. It may even help protect your baby from the flu for up to 6 months after birth. Wash your hands often with soap and  warm water for at least 20 seconds. If soap and water are not available, use alcohol-based hand sanitizer. Do not come in close contact with sick people. Do not share food, drinks, or utensils with other people. Avoid touching your eyes, nose, and mouth. Clean frequently used surfaces at home, school, or work. Practice healthy lifestyle habits, such as: Eating a healthy, balanced diet. Drinking plenty of fluids. Exercising regularly or as told by your health care provider. Sleeping 7-9 hours each night. Finding ways to manage stress. What should I do if I have flu symptoms?  If you have any symptoms of the flu, even after getting a flu shot, contact your health care provider right away. To reduce fever, take over-the-counter acetaminophen as told by your health care provider. If you have the flu, your health care provider may give you antiviral medicine to keep the flu from becoming severe and to shorten how long it lasts. Avoid spreading the flu to others: Stay home until you are well. Cover your nose and mouth when you cough or sneeze. Wash your hands often. Follow these instructions at home: Take over-the-counter and prescription medicines only as told by your health care provider. Do not take any medicine, including cold or flu medicine, unless your health care provider tells you to do so. If you were prescribed antiviral medicine, take it as told by your health care provider. Do not stop taking the antiviral medicine even if you start to feel better. Eat a nutrient-rich diet that includes fresh fruits and vegetables, whole grains, lean protein, and low-fat dairy. Drink enough fluid to keep your urine clear or pale yellow. Get plenty of rest. Keep all follow-up visits. This is important. Contact a health care provider if: You have a fever or chills. You have a cough, sore throat, or stuffy nose. You have worsening or unusual muscle aches, headache, tiredness, or loss of  appetite. You have vomiting or diarrhea. Get help right away if: You have trouble breathing. You have chest pain. You have abdominal pain. You begin to have labor pains. You do not feel your baby move. You have diarrhea or vomiting that will not go away. You have dizziness or confusion. Your symptoms do not improve, even with treatment. These symptoms may represent a serious problem that is an emergency. Do not wait to see if the symptoms will go away. Get medical help right away. Call your local emergency services (911 in the [place] Do not  drive yourself to the hospital. Summary If you are pregnant, you are more likely to catch the flu. You are also more likely to have a more serious case of the flu. If you have flu-like symptoms, call your health care provider right away. If you develop serious illness from the flu, this can cause problems for you and your developing baby. The best way to prevent the flu is to get a flu shot before flu season starts. The flu shot is safe during pregnancy and not dangerous for your developing baby. If you have the flu and were prescribed antiviral medicine, take it as told by your health care provider. This information is not intended to replace advice given to you by your health care provider. Make sure you discuss any questions you have with your health care provider. Document Revised: 10/15/2019 Document Reviewed: 10/15/2019 Elsevier Patient Education  2022 Elsevier Inc.       Oral Glucose Tolerance Test During Pregnancy Why am I having this test? The oral glucose tolerance test (OGTT) is done to check how your body processes blood sugar (glucose). This is one of several tests used to diagnose diabetes that develops during pregnancy (gestational diabetes mellitus). Gestational diabetes is a short-term form of diabetes that some women develop while they are pregnant. It usually occurs during the second trimester of pregnancy and goes away after  delivery. Testing, or screening, for gestational diabetes usually occurs at weeks 24-28 of pregnancy. You may have the OGTT test after having a 1-hour glucose screening test if the results from that test indicate that you may have gestational diabetes. This test may also be needed if: You have a history of gestational diabetes. There is a history of giving birth to very large babies or of losing pregnancies (having stillbirths). You have signs and symptoms of diabetes, such as: Changes in your eyesight. Tingling or numbness in your hands or feet. Changes in hunger, thirst, and urination, and these are not explained by your pregnancy. What is being tested? This test measures the amount of glucose in your blood at different times during a period of 3 hours. This shows how well your body can process glucose. What kind of sample is taken? Blood samples are required for this test. They are usually collected by inserting a needle into a blood vessel. How do I prepare for this test? For 3 days before your test, eat normally. Have plenty of carbohydrate-rich foods. Follow instructions from your health care provider about: Eating or drinking restrictions on the day of the test. You may be asked not to eat or drink anything other than water (to fast) starting 8-10 hours before the test. Changing or stopping your regular medicines. Some medicines may interfere with this test. Tell a health care provider about: All medicines you are taking, including vitamins, herbs, eye drops, creams, and over-the-counter medicines. Any blood disorders you have. Any surgeries you have had. Any medical conditions you have. What happens during the test? First, your blood glucose will be measured. This is referred to as your fasting blood glucose because you fasted before the test. Then, you will drink a glucose solution that contains a certain amount of glucose. Your blood glucose will be measured again 1, 2, and 3 hours  after you drink the solution. This test takes about 3 hours to complete. You will need to stay at the testing location during this time. During the testing period: Do not eat or drink anything other than the glucose solution.  Do not exercise. Do not use any products that contain nicotine or tobacco, such as cigarettes, e-cigarettes, and chewing tobacco. These can affect your test results. If you need help quitting, ask your health care provider. The testing procedure may vary among health care providers and hospitals. How are the results reported? Your results will be reported as milligrams of glucose per deciliter of blood (mg/dL) or millimoles per liter (mmol/L). There is more than one source for screening and diagnosis reference values used to diagnose gestational diabetes. Your health care provider will compare your results to normal values that were established after testing a large group of people (reference values). Reference values may vary among labs and hospitals. For this test (Carpenter-Coustan), reference values are: Fasting: 95 mg/dL (5.3 mmol/L). 1 hour: 180 mg/dL (00.9 mmol/L). 2 hour: 155 mg/dL (8.6 mmol/L). 3 hour: 140 mg/dL (7.8 mmol/L). What do the results mean? Results below the reference values are considered normal. If two or more of your blood glucose levels are at or above the reference values, you may be diagnosed with gestational diabetes. If only one level is high, your health care provider may suggest repeat testing or other tests to confirm a diagnosis. Talk with your health care provider about what your results mean. Questions to ask your health care provider Ask your health care provider, or the department that is doing the test: When will my results be ready? How will I get my results? What are my treatment options? What other tests do I need? What are my next steps? Summary The oral glucose tolerance test (OGTT) is one of several tests used to diagnose  diabetes that develops during pregnancy (gestational diabetes mellitus). Gestational diabetes is a short-term form of diabetes that some women develop while they are pregnant. You may have the OGTT test after having a 1-hour glucose screening test if the results from that test show that you may have gestational diabetes. You may also have this test if you have any symptoms or risk factors for this type of diabetes. Talk with your health care provider about what your results mean. This information is not intended to replace advice given to you by your health care provider. Make sure you discuss any questions you have with your health care provider. Document Revised: 08/04/2019 Document Reviewed: 08/04/2019 Elsevier Patient Education  2022 [employer].

## 2020-11-28 NOTE — Progress Notes (Signed)
ROB/GTT.  Patient declined TDAP and FLU Vaccines.

## 2020-11-28 NOTE — Progress Notes (Signed)
Subjective:  Joyce Taylor is a 20 y.o. G1P0 at [redacted]w[redacted]d being seen today for ongoing prenatal care.  She is currently monitored for the following issues for this low-risk pregnancy and has Encounter for supervision of normal first pregnancy in first trimester; Alpha thalassemia silent carrier; and Chlamydia trachomatis infection in pregnancy on their problem list.  Patient reports no complaints.  Contractions: Not present. Vag. Bleeding: None.  Movement: Present. Denies leaking of fluid.   The following portions of the patient's history were reviewed and updated as appropriate: allergies, current medications, past family history, past medical history, past social history, past surgical history and problem list. Problem list updated.  Objective:   Vitals:   11/28/20 0825  BP: 119/71  Pulse: 89  Weight: 152 lb (68.9 kg)    Fetal Status: Fetal Heart Rate (bpm): 157   Movement: Present     General:  Alert, oriented and cooperative. Patient is in no acute distress.  Skin: Skin is warm and dry. No rash noted.   Cardiovascular: Normal heart rate noted  Respiratory: Normal respiratory effort, no problems with respiration noted  Abdomen: Soft, gravid, appropriate for gestational age. Pain/Pressure: Absent     Pelvic: Vag. Bleeding: None     Cervical exam deferred        Extremities: Normal range of motion.  Edema: None  Mental Status: Normal mood and affect. Normal behavior. Normal judgment and thought content.   Urinalysis:      Assessment and Plan:  Pregnancy: G1P0 at [redacted]w[redacted]d  1. Encounter for supervision of normal first pregnancy in first trimester - Glucose Tolerance, 2 Hours w/1 Hour - RPR - CBC - HIV antibody (with reflex) - TOC for CT today, confirms took medication in July  PHQ9 SCORE ONLY 11/28/2020 08/15/2020  PHQ-9 Total Score 0 1   GAD 7 : Generalized Anxiety Score 11/28/2020 08/15/2020  Nervous, Anxious, on Edge 1 0  Control/stop worrying 0 0  Worry too much - different things  0 0  Trouble relaxing 0 0  Restless 0 0  Easily annoyed or irritable 1 1  Afraid - awful might happen 0 0  Total GAD 7 Score 2 1  Anxiety Difficulty Not difficult at all -   2. [redacted] weeks gestation of pregnancy - Glucose Tolerance, 2 Hours w/1 Hour - RPR - CBC - HIV antibody (with reflex)  Preterm labor symptoms and general obstetric precautions including but not limited to vaginal bleeding, contractions, leaking of fluid and fetal movement were reviewed in detail with the patient. I discussed the assessment and treatment plan with the patient. The patient was provided an opportunity to ask questions and all were answered. The patient agreed with the plan and demonstrated an understanding of the instructions. The patient was advised to call back or seek an in-person office evaluation/go to MAU at [facility] for any urgent or concerning symptoms. Please refer to After Visit Summary for other counseling recommendations.  Return in about 2 weeks (around 12/12/2020) for in-person LOB/APP OK.   Alzada Brazee, Odie Sera, NP

## 2020-11-29 ENCOUNTER — Telehealth: Payer: Self-pay | Admitting: Obstetrics and Gynecology

## 2020-11-29 LAB — CBC
Hematocrit: 31.5 % — ABNORMAL LOW (ref 34.0–46.6)
Hemoglobin: 10.3 g/dL — ABNORMAL LOW (ref 11.1–15.9)
MCH: 28.9 pg (ref 26.6–33.0)
MCHC: 32.7 g/dL (ref 31.5–35.7)
MCV: 88 fL (ref 79–97)
Platelets: 169 10*3/uL (ref 150–450)
RBC: 3.57 x10E6/uL — ABNORMAL LOW (ref 3.77–5.28)
RDW: 12.4 % (ref 11.7–15.4)
WBC: 4.8 10*3/uL (ref 3.4–10.8)

## 2020-11-29 LAB — CERVICOVAGINAL ANCILLARY ONLY
Chlamydia: NEGATIVE
Comment: NEGATIVE
Comment: NORMAL
Neisseria Gonorrhea: NEGATIVE

## 2020-11-29 LAB — GLUCOSE TOLERANCE, 2 HOURS W/ 1HR
Glucose, 1 hour: 66 mg/dL (ref 65–179)
Glucose, 2 hour: 52 mg/dL — ABNORMAL LOW (ref 65–152)
Glucose, Fasting: 68 mg/dL (ref 65–91)

## 2020-11-29 LAB — RPR: RPR Ser Ql: NONREACTIVE

## 2020-11-29 LAB — HIV ANTIBODY (ROUTINE TESTING W REFLEX): HIV Screen 4th Generation wRfx: NONREACTIVE

## 2020-11-29 NOTE — Telephone Encounter (Signed)
I spoke with Loyal Buba (dob 06/05/2000) regarding the results of his carrier screening for alpha thalassemia which were drawn following genetic counseling for Ms. Boatman on November 06, 2020.  Ms. Schreier is known to be a silent carrier for alpha thalassemia.  Mr. Yetta Barre' testing showed that he is also a silent carrier for alpha thalassemia.  This means that 1 in 4 of their children will inherit only 2 functional copies of the alpha globin gene, which we would not expect to have significant clinical implications.  One half of their children may inherit 1 nonfunctioning copy of this gene and be silent carriers just like the parents, and 1 in 4 pregnancies may inherit the 2 normal gene copies from each parent (for a total of 4 functioning copies of this gene).  I have been unable to reach Ms. Creeden by phone to provide her with this follow-up information.  Domenic Schwab was encouraged to speak with her about these results.  If Ms. Hardt has any additional questions she can reach out to our office.  At this time we would not recommend any additional follow-up testing for this pregnancy.  We may be reached at [phone].  Cherly Anderson, MS, CGC

## 2020-12-04 ENCOUNTER — Ambulatory Visit: Payer: Self-pay

## 2020-12-12 ENCOUNTER — Other Ambulatory Visit: Payer: Self-pay

## 2020-12-12 ENCOUNTER — Ambulatory Visit (INDEPENDENT_AMBULATORY_CARE_PROVIDER_SITE_OTHER): Payer: 59 | Admitting: Women's Health

## 2020-12-12 VITALS — BP 121/72 | HR 90 | Wt 154.0 lb

## 2020-12-12 DIAGNOSIS — Z3A29 29 weeks gestation of pregnancy: Secondary | ICD-10-CM

## 2020-12-12 DIAGNOSIS — Z3401 Encounter for supervision of normal first pregnancy, first trimester: Secondary | ICD-10-CM

## 2020-12-12 NOTE — Patient Instructions (Signed)
Maternity Assessment Unit (MAU) ° °The Maternity Assessment Unit (MAU) is located at the [facility]. The address is: [address], [address], [place], [place] [address]. Please see map below for additional directions. ° ° ° °The Maternity Assessment Unit is designed to help you during your pregnancy, and for up to 6 weeks after delivery, with any pregnancy- or postpartum-related emergencies, if you think you are in labor, or if your water has broken. For example, if you experience nausea and vomiting, vaginal bleeding, severe abdominal or pelvic pain, elevated blood pressure or other problems related to your pregnancy or postpartum time, please come to the Maternity Assessment Unit for assistance. ° ° ° ° ° ° °Preterm Labor °The normal length of a pregnancy is 39-41 weeks. Preterm labor is when labor starts before 37 completed weeks of pregnancy. Babies who are born prematurely and survive may not be fully developed and may be at an increased risk for long-term problems such as cerebral palsy, developmental delays, and vision and hearing problems. °Babies who are born too early may have problems soon after birth. Premature babies may have problems regulating blood sugar, body temperature, heart rate, and breathing rate. These babies often have trouble with feeding. The risk of having problems is highest for babies who are born before 34 weeks of pregnancy. °What are the causes? °The exact cause of this condition is not known. °What increases the risk? °You are more likely to have preterm labor if you have certain risk factors that relate to your medical history, problems with present and past pregnancies, and lifestyle factors. °Medical history °You have abnormalities of the uterus, including a short cervix. °You have STIs (sexually transmitted infections) or other infections of the urinary tract and the vagina. °You have chronic illnesses, such as blood clotting  problems, diabetes, or high blood pressure. °You are overweight or underweight. °Present and past pregnancies °You have had preterm labor before. °You are pregnant with twins or other multiples. °You have been diagnosed with a condition in which the placenta covers your cervix (placenta previa). °You waited less than 18 months between giving birth and becoming pregnant again. °Your unborn baby has some abnormalities. °You have vaginal bleeding during pregnancy. °You became pregnant through in vitro fertilization (IVF). °Lifestyle and environmental factors °You use tobacco products or drink alcohol. °You use drugs. °You have stress and no social support. °You experience domestic violence. °You are exposed to certain chemicals or environmental pollutants. °Other factors °You are younger than age 17 or older than age 35. °What are the signs or symptoms? °Symptoms of this condition include: °Cramps similar to those that can happen during a menstrual period. The cramps may happen with diarrhea. °Pain in the abdomen or lower back. °Regular contractions that may feel like tightening of the abdomen. °A feeling of increased pressure in the pelvis. °Increased watery or bloody mucus discharge from the vagina. °Water breaking (ruptured amniotic sac). °How is this diagnosed? °This condition is diagnosed based on: °Your medical history and a physical exam. °A pelvic exam. °An ultrasound. °Monitoring your uterus for contractions. °Other tests, including: °A swab of the cervix to check for a chemical called fetal fibronectin. °Urine tests. °How is this treated? °Treatment for this condition depends on the length of your pregnancy, your condition, and the health of your baby. Treatment may include: °Taking medicines, such as: °Hormone medicines. These may be given early in pregnancy to help support the pregnancy. °Medicines to stop   contractions. °Medicines to help mature the baby's lungs. These may be prescribed if the risk of  delivery is high. °Medicines to help protect your baby from brain and nerve complications such as cerebral palsy. °Bed rest. If the labor happens before 34 weeks of pregnancy, you may need to stay in the hospital. °Delivery of the baby. °Follow these instructions at home: ° °Do not use any products that contain nicotine or tobacco. These products include cigarettes, chewing tobacco, and vaping devices, such as e-cigarettes. If you need help quitting, ask your health care provider. °Do not drink alcohol. °Take over-the-counter and prescription medicines only as told by your health care provider. °Rest as told by your health care provider. °Return to your normal activities as told by your health care provider. Ask your health care provider what activities are safe for you. °Keep all follow-up visits. This is important. °How is this prevented? °To increase your chance of having a full-term pregnancy: °Do not use drugs or take medicines that have not been prescribed to you during your pregnancy. °Talk with your health care provider before taking any herbal supplements, even if you have been taking them regularly. °Make sure you gain a healthy amount of weight during your pregnancy. °Watch for infection. If you think that you might have an infection, get it checked right away. Symptoms of infection may include: °Fever. °Abnormal vaginal discharge or discharge that smells bad. °Pain or burning with urination. °Needing to urinate urgently. °Frequently urinating or passing small amounts of urine frequently. °Blood in your urine or urine that smells bad or unusual. °Where to find more information °[place] Department of Health and Human Services Office on Women's Health: www.womenshealth.gov °The American College of Obstetricians and Gynecologists: www.acog.org °Centers for Disease Control and Prevention, Preterm Birth: www.cdc.gov °Contact a health care provider if: °You think you are going into preterm labor. °You have signs  or symptoms of preterm labor. °You have symptoms of infection. °Get help right away if: °You are having regular, painful contractions every 5 minutes or less. °Your water breaks. °Summary °Preterm labor is labor that starts before you reach 37 weeks of pregnancy. °Delivering your baby early increases your baby's risk of developing long-term problems. °You are more likely to have preterm labor if you have certain risk factors that relate to your medical history, problems with present and past pregnancies, and lifestyle factors. °Keep all follow-up visits. This is important. °Contact a health care provider if you have signs or symptoms of preterm labor. °This information is not intended to replace advice given to you by your health care provider. Make sure you discuss any questions you have with your health care provider. °Document Revised: 02/28/2020 Document Reviewed: 02/28/2020 °Elsevier Patient Education © 2022 [employer] ° ° ° ° ° ° °Third Trimester of Pregnancy °The third trimester of pregnancy is from week 28 through week 40. This is months 7 through 9. The third trimester is a time when the unborn baby (fetus) is growing rapidly. At the end of the ninth month, the fetus is about 20 inches long and weighs 6-10 pounds. °Body changes during your third trimester °During the third trimester, your body will continue to go through many changes. The changes vary and generally return to normal after your baby is born. °Physical changes °Your weight will continue to increase. You can expect to gain 25-35 pounds (11-16 kg) by the end of the pregnancy if you begin pregnancy at a normal weight. If you are underweight, you can   expect to gain 28-40 lb (about 13-18 kg), and if you are overweight, you can expect to gain 15-25 lb (about 7-11 kg). °You may begin to get stretch marks on your hips, abdomen, and breasts. °Your breasts will continue to grow and may hurt. A yellow fluid (colostrum) may leak from your breasts. This  is the first milk you are producing for your baby. °You may have changes in your hair. These can include thickening of your hair, rapid growth, and changes in texture. Some people also have hair loss during or after pregnancy, or hair that feels dry or thin. °Your belly button may stick out. °You may notice more swelling in your hands, face, or ankles. °Health changes °You may have heartburn. °You may have constipation. °You may develop hemorrhoids. °You may develop swollen, bulging veins (varicose veins) in your legs. °You may have increased body aches in the pelvis, back, or thighs. This is due to weight gain and increased hormones that are relaxing your joints. °You may have increased tingling or numbness in your hands, arms, and legs. The skin on your abdomen may also feel numb. °You may feel short of breath because of your expanding uterus. °Other changes °You may urinate more often because the fetus is moving lower into your pelvis and pressing on your bladder. °You may have more problems sleeping. This may be caused by the size of your abdomen, an increased need to urinate, and an increase in your body's metabolism. °You may notice the fetus "dropping," or moving lower in your abdomen (lightening). °You may have increased vaginal discharge. °You may notice that you have pain around your pelvic bone as your uterus distends. °Follow these instructions at home: °Medicines °Follow your health care provider's instructions regarding medicine use. Specific medicines may be either safe or unsafe to take during pregnancy. Do not take any medicines unless approved by your health care provider. °Take a prenatal vitamin that contains at least 600 micrograms (mcg) of folic acid. °Eating and drinking °Eat a healthy diet that includes fresh fruits and vegetables, whole grains, good sources of protein such as meat, eggs, or tofu, and low-fat dairy products. °Avoid raw meat and unpasteurized juice, milk, and cheese. These  carry germs that can harm you and your baby. °Eat 4 or 5 small meals rather than 3 large meals a day. °You may need to take these actions to prevent or treat constipation: °Drink enough fluid to keep your urine pale yellow. °Eat foods that are high in fiber, such as beans, whole grains, and fresh fruits and vegetables. °Limit foods that are high in fat and processed sugars, such as fried or sweet foods. °Activity °Exercise only as directed by your health care provider. Most people can continue their usual exercise routine during pregnancy. Try to exercise for 30 minutes at least 5 days a week. Stop exercising if you experience contractions in the uterus. °Stop exercising if you develop pain or cramping in the lower abdomen or lower back. °Avoid heavy lifting. °Do not exercise if it is very hot or humid or if you are at a high altitude. °If you choose to, you may continue to have sex unless your health care provider tells you not to. °Relieving pain and discomfort °Take frequent breaks and rest with your legs raised (elevated) if you have leg cramps or low back pain. °Take warm sitz baths to soothe any pain or discomfort caused by hemorrhoids. Use hemorrhoid cream if your health care provider approves. °Wear a supportive   bra to prevent discomfort from breast tenderness. °If you develop varicose veins: °Wear support hose as told by your health care provider. °Elevate your feet for 15 minutes, 3-4 times a day. °Limit salt in your diet. °Safety °Talk to your health care provider before traveling far distances. °Do not use hot tubs, steam rooms, or saunas. °Wear your seat belt at all times when driving or riding in a car. °Talk with your health care provider if someone is verbally or physically abusive to you. °Preparing for birth °To prepare for the arrival of your baby: °Take prenatal classes to understand, practice, and ask questions about labor and delivery. °Visit the hospital and tour the maternity area. °Purchase  a rear-facing car seat and make sure you know how to install it in your car. °Prepare the baby's room or sleeping area. Make sure to remove all pillows and stuffed animals from the baby's crib to prevent suffocation. °General instructions °Avoid cat litter boxes and soil used by cats. These carry germs that can cause birth defects in the baby. If you have a cat, ask someone to clean the litter box for you. °Do not douche or use tampons. Do not use scented sanitary pads. °Do not use any products that contain nicotine or tobacco, such as cigarettes, e-cigarettes, and chewing tobacco. If you need help quitting, ask your health care provider. °Do not use any herbal remedies, illegal drugs, or medicines that were not prescribed to you. Chemicals in these products can harm your baby. °Do not drink alcohol. °You will have more frequent prenatal exams during the third trimester. During a routine prenatal visit, your health care provider will do a physical exam, perform tests, and discuss your overall health. Keep all follow-up visits. This is important. °Where to find more information °American Pregnancy Association: americanpregnancy.org °American College of Obstetricians and Gynecologists: acog.org/en/Womens%20Health/Pregnancy °Office on Women's Health: womenshealth.gov/pregnancy °Contact a health care provider if you have: °A fever. °Mild pelvic cramps, pelvic pressure, or nagging pain in your abdominal area or lower back. °Vomiting or diarrhea. °Bad-smelling vaginal discharge or foul-smelling urine. °Pain when you urinate. °A headache that does not go away when you take medicine. °Visual changes or see spots in front of your eyes. °Get help right away if: °Your water breaks. °You have regular contractions less than 5 minutes apart. °You have spotting or bleeding from your vagina. °You have severe abdominal pain. °You have difficulty breathing. °You have chest pain. °You have fainting spells. °You have not felt your baby  move for the time period told by your health care provider. °You have new or increased pain, swelling, or redness in an arm or leg. °Summary °The third trimester of pregnancy is from week 28 through week 40 (months 7 through 9). °You may have more problems sleeping. This can be caused by the size of your abdomen, an increased need to urinate, and an increase in your body's metabolism. °You will have more frequent prenatal exams during the third trimester. Keep all follow-up visits. This is important. °This information is not intended to replace advice given to you by your health care provider. Make sure you discuss any questions you have with your health care provider. °Document Revised: 08/03/2019 Document Reviewed: 06/09/2019 °Elsevier Patient Education © 2022 Elsevier Inc. ° °

## 2020-12-12 NOTE — Progress Notes (Signed)
Subjective:  Joyce Taylor is a 20 y.o. G1P0 at [redacted]w[redacted]d being seen today for ongoing prenatal care.  She is currently monitored for the following issues for this low-risk pregnancy and has Encounter for supervision of normal first pregnancy in first trimester; Alpha thalassemia silent carrier; and Chlamydia trachomatis infection in pregnancy on their problem list.  Patient reports no complaints.  Contractions: Not present. Vag. Bleeding: None.  Movement: Present. Denies leaking of fluid.   The following portions of the patient's history were reviewed and updated as appropriate: allergies, current medications, past family history, past medical history, past social history, past surgical history and problem list. Problem list updated.  Objective:   Vitals:   12/12/20 1436  BP: 121/72  Pulse: 90  Weight: 154 lb (69.9 kg)    Fetal Status: Fetal Heart Rate (bpm): 161 Fundal Height: 29 cm Movement: Present     General:  Alert, oriented and cooperative. Patient is in no acute distress.  Skin: Skin is warm and dry. No rash noted.   Cardiovascular: Normal heart rate noted  Respiratory: Normal respiratory effort, no problems with respiration noted  Abdomen: Soft, gravid, appropriate for gestational age. Pain/Pressure: Absent     Pelvic: Vag. Bleeding: None     Cervical exam deferred        Extremities: Normal range of motion.  Edema: None  Mental Status: Normal mood and affect. Normal behavior. Normal judgment and thought content.   Urinalysis:      Assessment and Plan:  Pregnancy: G1P0 at 109w0d  1. Encounter for supervision of normal first pregnancy in first trimester -no concerns  2. [redacted] weeks gestation of pregnancy  Preterm labor symptoms and general obstetric precautions including but not limited to vaginal bleeding, contractions, leaking of fluid and fetal movement were reviewed in detail with the patient. I discussed the assessment and treatment plan with the patient. The patient was  provided an opportunity to ask questions and all were answered. The patient agreed with the plan and demonstrated an understanding of the instructions. The patient was advised to call back or seek an in-person office evaluation/go to MAU at [facility] for any urgent or concerning symptoms. Please refer to After Visit Summary for other counseling recommendations.  Return in about 2 weeks (around 12/26/2020) for in-person LOB/APP OK.   Zeniyah Peaster, Odie Sera, NP

## 2020-12-27 ENCOUNTER — Encounter: Payer: 59 | Admitting: Nurse Practitioner

## 2021-01-02 ENCOUNTER — Encounter: Payer: 59 | Admitting: Obstetrics

## 2021-01-03 ENCOUNTER — Encounter: Payer: Self-pay | Admitting: Obstetrics

## 2021-01-03 ENCOUNTER — Ambulatory Visit (INDEPENDENT_AMBULATORY_CARE_PROVIDER_SITE_OTHER): Payer: 59 | Admitting: Obstetrics

## 2021-01-03 ENCOUNTER — Other Ambulatory Visit: Payer: Self-pay

## 2021-01-03 VITALS — BP 127/64 | HR 87 | Wt 161.0 lb

## 2021-01-03 DIAGNOSIS — Z3480 Encounter for supervision of other normal pregnancy, unspecified trimester: Secondary | ICD-10-CM

## 2021-01-03 MED ORDER — VITAFOL GUMMIES 3.33-0.333-34.8 MG PO CHEW: 3.0000 | CHEWABLE_TABLET | Freq: Every day | ORAL | 11 refills | Status: AC

## 2021-01-03 NOTE — Progress Notes (Signed)
Subjective:  Joyce Taylor is a 20 y.o. G1P0 at [redacted]w[redacted]d being seen today for ongoing prenatal care.  She is currently monitored for the following issues for this low-risk pregnancy and has Encounter for supervision of normal first pregnancy in first trimester; Alpha thalassemia silent carrier; and Chlamydia trachomatis infection in pregnancy on their problem list.  Patient reports no complaints.  Contractions: Not present. Vag. Bleeding: None.  Movement: Present. Denies leaking of fluid.   The following portions of the patient's history were reviewed and updated as appropriate: allergies, current medications, past family history, past medical history, past social history, past surgical history and problem list. Problem list updated.  Objective:   Vitals:   01/03/21 1451  BP: 127/64  Pulse: 87  Weight: 161 lb (73 kg)    Fetal Status: Fetal Heart Rate (bpm): 140   Movement: Present     General:  Alert, oriented and cooperative. Patient is in no acute distress.  Skin: Skin is warm and dry. No rash noted.   Cardiovascular: Normal heart rate noted  Respiratory: Normal respiratory effort, no problems with respiration noted  Abdomen: Soft, gravid, appropriate for gestational age. Pain/Pressure: Absent     Pelvic:  Cervical exam deferred        Extremities: Normal range of motion.  Edema: None  Mental Status: Normal mood and affect. Normal behavior. Normal judgment and thought content.   Urinalysis:      Assessment and Plan:  Pregnancy: G1P0 at [redacted]w[redacted]d  1. Supervision of other normal pregnancy, antepartum Rx: - Prenatal Vit-Fe Phos-FA-Omega (VITAFOL GUMMIES) 3.33-0.333-34.8 MG CHEW; Chew 3 tablets by mouth daily before breakfast.  Dispense: 90 tablet; Refill: 11  Preterm labor symptoms and general obstetric precautions including but not limited to vaginal bleeding, contractions, leaking of fluid and fetal movement were reviewed in detail with the patient. Please refer to After Visit Summary  for other counseling recommendations.   Return in about 2 weeks (around 01/17/2021).   Brock Bad, MD  01/03/21

## 2021-01-15 ENCOUNTER — Encounter: Payer: 59 | Admitting: Advanced Practice Midwife

## 2021-01-15 NOTE — Progress Notes (Deleted)
   PRENATAL VISIT NOTE  Subjective:  Joyce Taylor is a 20 y.o. G1P0 at [redacted]w[redacted]d being seen today for ongoing prenatal care.  She is currently monitored for the following issues for this {Blank single:19197::"high-risk","low-risk"} pregnancy and has Encounter for supervision of normal first pregnancy in first trimester; Alpha thalassemia silent carrier; and Chlamydia trachomatis infection in pregnancy on their problem list.  Patient reports {sx:14538}.   .  .   . Denies leaking of fluid.   The following portions of the patient's history were reviewed and updated as appropriate: allergies, current medications, past family history, past medical history, past social history, past surgical history and problem list.   Objective:  There were no vitals filed for this visit.  Fetal Status:           General:  Alert, oriented and cooperative. Patient is in no acute distress.  Skin: Skin is warm and dry. No rash noted.   Cardiovascular: Normal heart rate noted  Respiratory: Normal respiratory effort, no problems with respiration noted  Abdomen: Soft, gravid, appropriate for gestational age.        Pelvic: {Blank single:19197::"Cervical exam performed in the presence of a chaperone","Cervical exam deferred"}        Extremities: Normal range of motion.     Mental Status: Normal mood and affect. Normal behavior. Normal judgment and thought content.   Assessment and Plan:  Pregnancy: G1P0 at [redacted]w[redacted]d 1. Supervision of other normal pregnancy, antepartum ***  2. Chlamydia trachomatis infection in mother during second trimester of pregnancy --Positive 09/20/19, TOC negative on 11/28/20.  3. [redacted] weeks gestation of pregnancy ***  {Blank single:19197::"Term","Preterm"} labor symptoms and general obstetric precautions including but not limited to vaginal bleeding, contractions, leaking of fluid and fetal movement were reviewed in detail with the patient. Please refer to After Visit Summary for other counseling  recommendations.   No follow-ups on file.  Future Appointments  Date Time Provider Department Center  01/15/2021  1:50 PM Leftwich-Kirby, Wilmer Floor, CNM [facility] None    Sharen Counter, CNM

## 2021-01-21 ENCOUNTER — Ambulatory Visit (INDEPENDENT_AMBULATORY_CARE_PROVIDER_SITE_OTHER): Payer: 59 | Admitting: Obstetrics and Gynecology

## 2021-01-21 ENCOUNTER — Other Ambulatory Visit: Payer: Self-pay

## 2021-01-21 ENCOUNTER — Encounter: Payer: Self-pay | Admitting: Obstetrics and Gynecology

## 2021-01-21 VITALS — BP 120/68 | HR 95 | Wt 166.0 lb

## 2021-01-21 DIAGNOSIS — Z3401 Encounter for supervision of normal first pregnancy, first trimester: Secondary | ICD-10-CM

## 2021-01-21 NOTE — Progress Notes (Signed)
   PRENATAL VISIT NOTE  Subjective:  Joyce Taylor is a 20 y.o. G1P0 at [redacted]w[redacted]d being seen today for ongoing prenatal care.  She is currently monitored for the following issues for this low-risk pregnancy and has Encounter for supervision of normal first pregnancy in first trimester; Alpha thalassemia silent carrier; and Chlamydia trachomatis infection in pregnancy on their problem list.  Patient reports no complaints.  Contractions: Not present. Vag. Bleeding: None.  Movement: Present. Denies leaking of fluid.   The following portions of the patient's history were reviewed and updated as appropriate: allergies, current medications, past family history, past medical history, past social history, past surgical history and problem list.   Objective:   Vitals:   01/21/21 1402  BP: 120/68  Pulse: 95  Weight: 166 lb (75.3 kg)    Fetal Status: Fetal Heart Rate (bpm): 156 Fundal Height: 34 cm Movement: Present     General:  Alert, oriented and cooperative. Patient is in no acute distress.  Skin: Skin is warm and dry. No rash noted.   Cardiovascular: Normal heart rate noted  Respiratory: Normal respiratory effort, no problems with respiration noted  Abdomen: Soft, gravid, appropriate for gestational age.  Pain/Pressure: Absent     Pelvic: Cervical exam deferred        Extremities: Normal range of motion.  Edema: None  Mental Status: Normal mood and affect. Normal behavior. Normal judgment and thought content.   Assessment and Plan:  Pregnancy: G1P0 at [redacted]w[redacted]d 1. Encounter for supervision of normal first pregnancy in first trimester Patient is doing well without complaints Cultures next visit  Preterm labor symptoms and general obstetric precautions including but not limited to vaginal bleeding, contractions, leaking of fluid and fetal movement were reviewed in detail with the patient. Please refer to After Visit Summary for other counseling recommendations.   Return in about 2 weeks  (around 02/04/2021) for in person, ROB, Low risk.  No future appointments.  Catalina Antigua, MD

## 2021-02-04 ENCOUNTER — Ambulatory Visit (INDEPENDENT_AMBULATORY_CARE_PROVIDER_SITE_OTHER): Payer: 59 | Admitting: Obstetrics and Gynecology

## 2021-02-04 ENCOUNTER — Other Ambulatory Visit: Payer: Self-pay

## 2021-02-04 ENCOUNTER — Encounter: Payer: Self-pay | Admitting: Obstetrics and Gynecology

## 2021-02-04 ENCOUNTER — Other Ambulatory Visit (HOSPITAL_COMMUNITY)
Admission: RE | Admit: 2021-02-04 | Discharge: 2021-02-04 | Disposition: A | Discharge status: Home / Self Care | Payer: 59 | Source: Ambulatory Visit | Attending: Obstetrics and Gynecology | Admitting: Obstetrics and Gynecology

## 2021-02-04 VITALS — BP 120/76 | HR 89 | Wt 168.4 lb

## 2021-02-04 DIAGNOSIS — Z3401 Encounter for supervision of normal first pregnancy, first trimester: Secondary | ICD-10-CM | POA: Insufficient documentation

## 2021-02-04 DIAGNOSIS — A568 Sexually transmitted chlamydial infection of other sites: Secondary | ICD-10-CM

## 2021-02-04 DIAGNOSIS — O98313 Other infections with a predominantly sexual mode of transmission complicating pregnancy, third trimester: Secondary | ICD-10-CM

## 2021-02-04 NOTE — Progress Notes (Signed)
   PRENATAL VISIT NOTE  Subjective:  Joyce Taylor is a 20 y.o. G1P0 at [redacted]w[redacted]d being seen today for ongoing prenatal care.  She is currently monitored for the following issues for this low-risk pregnancy and has Encounter for supervision of normal first pregnancy in first trimester; Alpha thalassemia silent carrier; and Chlamydia trachomatis infection in pregnancy on their problem list.  Patient reports no complaints.  Contractions: Irritability. Vag. Bleeding: None.  Movement: Present. Denies leaking of fluid.   The following portions of the patient's history were reviewed and updated as appropriate: allergies, current medications, past family history, past medical history, past social history, past surgical history and problem list.   Objective:   Vitals:   02/04/21 1424  BP: 120/76  Pulse: 89  Weight: 168 lb 6.4 oz (76.4 kg)    Fetal Status: Fetal Heart Rate (bpm): 162 Fundal Height: 35 cm Movement: Present     General:  Alert, oriented and cooperative. Patient is in no acute distress.  Skin: Skin is warm and dry. No rash noted.   Cardiovascular: Normal heart rate noted  Respiratory: Normal respiratory effort, no problems with respiration noted  Abdomen: Soft, gravid, appropriate for gestational age.  Pain/Pressure: Present     Pelvic: Cervical exam deferred        Extremities: Normal range of motion.  Edema: None  Mental Status: Normal mood and affect. Normal behavior. Normal judgment and thought content.   Assessment and Plan:  Pregnancy: G1P0 at [redacted]w[redacted]d 1. Encounter for supervision of normal first pregnancy in first trimester - GBS/GC/CT done today  2. Chlamydia trachomatis infection in mother during third trimester of pregnancy - last check negative in September. Rechecked today per routine testing.   Preterm labor symptoms and general obstetric precautions including but not limited to vaginal bleeding, contractions, leaking of fluid and fetal movement were reviewed in  detail with the patient. Please refer to After Visit Summary for other counseling recommendations.   Return in about 1 week (around 02/11/2021) for OB VISIT, MD or APP.  No future appointments.  Milas Hock, MD

## 2021-02-04 NOTE — Addendum Note (Signed)
Addended by: Milas Hock A on: 02/04/2021 03:41 PM   Modules accepted: Orders

## 2021-02-05 LAB — CERVICOVAGINAL ANCILLARY ONLY
Chlamydia: POSITIVE — AB
Comment: NEGATIVE
Comment: NORMAL
Neisseria Gonorrhea: NEGATIVE

## 2021-02-06 MED ORDER — AZITHROMYCIN 500 MG PO TABS: 1000.0000 mg | ORAL_TABLET | Freq: Once | ORAL | 1 refills | Status: AC

## 2021-02-06 NOTE — Addendum Note (Signed)
Addended by: Milas Hock A on: 02/06/2021 12:36 PM   Modules accepted: Orders

## 2021-02-08 LAB — CULTURE, BETA STREP (GROUP B ONLY): Strep Gp B Culture: NEGATIVE

## 2021-02-11 ENCOUNTER — Other Ambulatory Visit: Payer: Self-pay

## 2021-02-11 ENCOUNTER — Encounter: Payer: Self-pay | Admitting: Obstetrics and Gynecology

## 2021-02-11 ENCOUNTER — Ambulatory Visit (INDEPENDENT_AMBULATORY_CARE_PROVIDER_SITE_OTHER): Payer: 59 | Admitting: Obstetrics and Gynecology

## 2021-02-11 VITALS — BP 123/75 | HR 91 | Wt 170.0 lb

## 2021-02-11 DIAGNOSIS — O98313 Other infections with a predominantly sexual mode of transmission complicating pregnancy, third trimester: Secondary | ICD-10-CM

## 2021-02-11 DIAGNOSIS — A568 Sexually transmitted chlamydial infection of other sites: Secondary | ICD-10-CM

## 2021-02-11 DIAGNOSIS — Z3401 Encounter for supervision of normal first pregnancy, first trimester: Secondary | ICD-10-CM

## 2021-02-11 NOTE — Progress Notes (Signed)
   PRENATAL VISIT NOTE  Subjective:  Joyce Taylor is a 20 y.o. G1P0 at [redacted]w[redacted]d being seen today for ongoing prenatal care.  She is currently monitored for the following issues for this low-risk pregnancy and has Encounter for supervision of normal first pregnancy in first trimester; Alpha thalassemia silent carrier; and Chlamydia trachomatis infection in pregnancy on their problem list.  Patient reports no complaints.  Contractions: Irritability. Vag. Bleeding: None.  Movement: Present. Denies leaking of fluid.   The following portions of the patient's history were reviewed and updated as appropriate: allergies, current medications, past family history, past medical history, past social history, past surgical history and problem list.   Objective:   Vitals:   02/11/21 1433  BP: 123/75  Pulse: 91  Weight: 170 lb (77.1 kg)    Fetal Status: Fetal Heart Rate (bpm): 144 Fundal Height: 36 cm Movement: Present     General:  Alert, oriented and cooperative. Patient is in no acute distress.  Skin: Skin is warm and dry. No rash noted.   Cardiovascular: Normal heart rate noted  Respiratory: Normal respiratory effort, no problems with respiration noted  Abdomen: Soft, gravid, appropriate for gestational age.  Pain/Pressure: Present     Pelvic: Cervical exam deferred        Extremities: Normal range of motion.  Edema: Trace  Mental Status: Normal mood and affect. Normal behavior. Normal judgment and thought content.   Assessment and Plan:  Pregnancy: G1P0 at [redacted]w[redacted]d 1. Encounter for supervision of normal first pregnancy in first trimester Patient is doing well without complaints  2. Chlamydia trachomatis infection in mother during third trimester of pregnancy Patient reports both her and partner were again treated   Term labor symptoms and general obstetric precautions including but not limited to vaginal bleeding, contractions, leaking of fluid and fetal movement were reviewed in detail with  the patient. Please refer to After Visit Summary for other counseling recommendations.   Return in about 1 week (around 02/18/2021) for in person, ROB, Low risk.  No future appointments.  Catalina Antigua, MD

## 2021-02-19 ENCOUNTER — Other Ambulatory Visit: Payer: Self-pay

## 2021-02-19 ENCOUNTER — Encounter: Payer: Self-pay | Admitting: Obstetrics

## 2021-02-19 ENCOUNTER — Ambulatory Visit (INDEPENDENT_AMBULATORY_CARE_PROVIDER_SITE_OTHER): Payer: 59 | Admitting: Obstetrics

## 2021-02-19 VITALS — BP 126/74 | HR 90 | Wt 172.0 lb

## 2021-02-19 DIAGNOSIS — Z3480 Encounter for supervision of other normal pregnancy, unspecified trimester: Secondary | ICD-10-CM

## 2021-02-19 DIAGNOSIS — A749 Chlamydial infection, unspecified: Secondary | ICD-10-CM

## 2021-02-19 DIAGNOSIS — O98812 Other maternal infectious and parasitic diseases complicating pregnancy, second trimester: Secondary | ICD-10-CM

## 2021-02-19 NOTE — Progress Notes (Signed)
Subjective:  Joyce Taylor is a 20 y.o. G1P0 at [redacted]w[redacted]d being seen today for ongoing prenatal care.  She is currently monitored for the following issues for this low-risk pregnancy and has Encounter for supervision of normal first pregnancy in first trimester; Alpha thalassemia silent carrier; and Chlamydia trachomatis infection in pregnancy on their problem list.  Patient reports no complaints.  Contractions: Irregular. Vag. Bleeding: None.  Movement: Present. Denies leaking of fluid.   The following portions of the patient's history were reviewed and updated as appropriate: allergies, current medications, past family history, past medical history, past social history, past surgical history and problem list. Problem list updated.  Objective:   Vitals:   02/19/21 1445  BP: 126/74  Pulse: 90  Weight: 172 lb (78 kg)    Fetal Status: Fetal Heart Rate (bpm): 139   Movement: Present     General:  Alert, oriented and cooperative. Patient is in no acute distress.  Skin: Skin is warm and dry. No rash noted.   Cardiovascular: Normal heart rate noted  Respiratory: Normal respiratory effort, no problems with respiration noted  Abdomen: Soft, gravid, appropriate for gestational age. Pain/Pressure: Present     Pelvic:  Cervical exam performed      2 cm / 50% / -3 / Vtx  Extremities: Normal range of motion.  Edema: None  Mental Status: Normal mood and affect. Normal behavior. Normal judgment and thought content.   Urinalysis:      Assessment and Plan:  Pregnancy: G1P0 at [redacted]w[redacted]d  1. Supervision of other normal pregnancy, antepartum  2. Chlamydia infection affecting pregnancy in second trimester, treated   Term labor symptoms and general obstetric precautions including but not limited to vaginal bleeding, contractions, leaking of fluid and fetal movement were reviewed in detail with the patient. Please refer to After Visit Summary for other counseling recommendations.   Return in about 1 week  (around 02/26/2021) for ROB.   Brock Bad, MD  02/21/21

## 2021-03-01 ENCOUNTER — Encounter: Payer: Self-pay | Admitting: Obstetrics and Gynecology

## 2021-03-01 ENCOUNTER — Other Ambulatory Visit: Payer: Self-pay

## 2021-03-01 ENCOUNTER — Ambulatory Visit (INDEPENDENT_AMBULATORY_CARE_PROVIDER_SITE_OTHER): Payer: 59 | Admitting: Obstetrics and Gynecology

## 2021-03-01 ENCOUNTER — Inpatient Hospital Stay (HOSPITAL_COMMUNITY)
Admission: AD | Admit: 2021-03-01 | Discharge: 2021-03-04 | DRG: 807 | Disposition: A | Discharge status: Home / Self Care | Payer: 59 | Attending: Obstetrics & Gynecology | Admitting: Obstetrics & Gynecology

## 2021-03-01 ENCOUNTER — Encounter (HOSPITAL_COMMUNITY): Payer: Self-pay | Admitting: Obstetrics and Gynecology

## 2021-03-01 VITALS — BP 141/92 | HR 60 | Wt 175.0 lb

## 2021-03-01 DIAGNOSIS — Z3A40 40 weeks gestation of pregnancy: Secondary | ICD-10-CM | POA: Diagnosis not present

## 2021-03-01 DIAGNOSIS — O480 Post-term pregnancy: Secondary | ICD-10-CM | POA: Diagnosis present

## 2021-03-01 DIAGNOSIS — D563 Thalassemia minor: Secondary | ICD-10-CM | POA: Diagnosis present

## 2021-03-01 DIAGNOSIS — Z3403 Encounter for supervision of normal first pregnancy, third trimester: Secondary | ICD-10-CM

## 2021-03-01 DIAGNOSIS — Z20822 Contact with and (suspected) exposure to covid-19: Secondary | ICD-10-CM | POA: Diagnosis present

## 2021-03-01 DIAGNOSIS — O134 Gestational [pregnancy-induced] hypertension without significant proteinuria, complicating childbirth: Secondary | ICD-10-CM | POA: Diagnosis present

## 2021-03-01 DIAGNOSIS — O133 Gestational [pregnancy-induced] hypertension without significant proteinuria, third trimester: Secondary | ICD-10-CM

## 2021-03-01 DIAGNOSIS — O98313 Other infections with a predominantly sexual mode of transmission complicating pregnancy, third trimester: Secondary | ICD-10-CM

## 2021-03-01 DIAGNOSIS — D62 Acute posthemorrhagic anemia: Secondary | ICD-10-CM

## 2021-03-01 LAB — URINALYSIS, ROUTINE W REFLEX MICROSCOPIC
Bilirubin Urine: NEGATIVE
Glucose, UA: NEGATIVE mg/dL
Hgb urine dipstick: NEGATIVE
Ketones, ur: NEGATIVE mg/dL
Nitrite: NEGATIVE
Protein, ur: NEGATIVE mg/dL
Specific Gravity, Urine: 1.02 (ref 1.005–1.030)
pH: 6.5 (ref 5.0–8.0)

## 2021-03-01 LAB — CBC
HCT: 30 % — ABNORMAL LOW (ref 36.0–46.0)
Hemoglobin: 9.5 g/dL — ABNORMAL LOW (ref 12.0–15.0)
MCH: 26.5 pg (ref 26.0–34.0)
MCHC: 31.7 g/dL (ref 30.0–36.0)
MCV: 83.8 fL (ref 80.0–100.0)
Platelets: 177 10*3/uL (ref 150–400)
RBC: 3.58 MIL/uL — ABNORMAL LOW (ref 3.87–5.11)
RDW: 13.7 % (ref 11.5–15.5)
WBC: 6.6 10*3/uL (ref 4.0–10.5)
nRBC: 0 % (ref 0.0–0.2)

## 2021-03-01 LAB — POCT URINALYSIS DIPSTICK
Bilirubin, UA: NEGATIVE
Blood, UA: NEGATIVE
Glucose, UA: NEGATIVE
Ketones, UA: NEGATIVE
Leukocytes, UA: NEGATIVE
Nitrite, UA: NEGATIVE
Protein, UA: POSITIVE — AB
Spec Grav, UA: 1.01 (ref 1.010–1.025)
Urobilinogen, UA: 0.2 E.U./dL
pH, UA: 7 (ref 5.0–8.0)

## 2021-03-01 LAB — PROTEIN / CREATININE RATIO, URINE
Creatinine, Urine: 98.18 mg/dL
Protein Creatinine Ratio: 0.14 mg/mg{Cre} (ref 0.00–0.15)
Total Protein, Urine: 14 mg/dL

## 2021-03-01 LAB — URINALYSIS, MICROSCOPIC (REFLEX)

## 2021-03-01 LAB — TYPE AND SCREEN
ABO/RH(D): A POS
Antibody Screen: NEGATIVE

## 2021-03-01 LAB — RESP PANEL BY RT-PCR (FLU A&B, COVID) ARPGX2
Influenza A by PCR: NEGATIVE
Influenza B by PCR: NEGATIVE
SARS Coronavirus 2 by RT PCR: NEGATIVE

## 2021-03-01 MED ORDER — FENTANYL CITRATE (PF) 100 MCG/2ML IJ SOLN
50.0000 ug | INTRAMUSCULAR | Status: DC | PRN
  Administered 2021-03-02 (×2): 100 ug via INTRAVENOUS
  Filled 2021-03-01 (×2): qty 2

## 2021-03-01 MED ORDER — ONDANSETRON HCL 4 MG/2ML IJ SOLN
4.0000 mg | Freq: Four times a day (QID) | INTRAMUSCULAR | Status: DC | PRN
  Administered 2021-03-01: 22:00:00 4 mg via INTRAVENOUS
  Filled 2021-03-01: qty 2

## 2021-03-01 MED ORDER — ACETAMINOPHEN 325 MG PO TABS: 650.0000 mg | ORAL_TABLET | ORAL | Status: DC | PRN

## 2021-03-01 MED ORDER — TERBUTALINE SULFATE 1 MG/ML IJ SOLN: 0.2500 mg | Freq: Once | INTRAMUSCULAR | Status: DC | PRN

## 2021-03-01 MED ORDER — LACTATED RINGERS IV SOLN: 500.0000 mL | INTRAVENOUS | Status: DC | PRN

## 2021-03-01 MED ORDER — MISOPROSTOL 50MCG HALF TABLET
50.0000 ug | ORAL_TABLET | ORAL | Status: DC | PRN
  Administered 2021-03-01 – 2021-03-02 (×2): 50 ug via BUCCAL
  Filled 2021-03-01: qty 1

## 2021-03-01 MED ORDER — LACTATED RINGERS IV SOLN: INTRAVENOUS | Status: DC

## 2021-03-01 MED ORDER — OXYCODONE-ACETAMINOPHEN 5-325 MG PO TABS: 1.0000 | ORAL_TABLET | ORAL | Status: DC | PRN

## 2021-03-01 MED ORDER — SOD CITRATE-CITRIC ACID 500-334 MG/5ML PO SOLN: 30.0000 mL | ORAL | Status: DC | PRN

## 2021-03-01 MED ORDER — OXYTOCIN BOLUS FROM INFUSION
333.0000 mL | Freq: Once | INTRAVENOUS | Status: AC
  Administered 2021-03-03: 01:00:00 333 mL via INTRAVENOUS

## 2021-03-01 MED ORDER — OXYCODONE-ACETAMINOPHEN 5-325 MG PO TABS: 2.0000 | ORAL_TABLET | ORAL | Status: DC | PRN

## 2021-03-01 MED ORDER — MISOPROSTOL 50MCG HALF TABLET
ORAL_TABLET | ORAL | Status: AC
  Filled 2021-03-01: qty 1

## 2021-03-01 MED ORDER — ZOLPIDEM TARTRATE 5 MG PO TABS
5.0000 mg | ORAL_TABLET | Freq: Every evening | ORAL | Status: DC | PRN
  Administered 2021-03-01 – 2021-03-02 (×2): 5 mg via ORAL
  Filled 2021-03-01 (×2): qty 1

## 2021-03-01 MED ORDER — OXYTOCIN-SODIUM CHLORIDE 30-0.9 UT/500ML-% IV SOLN
1.0000 m[IU]/min | INTRAVENOUS | Status: DC
  Administered 2021-03-02: 05:00:00 2 m[IU]/min via INTRAVENOUS

## 2021-03-01 MED ORDER — OXYTOCIN-SODIUM CHLORIDE 30-0.9 UT/500ML-% IV SOLN
2.5000 [IU]/h | INTRAVENOUS | Status: DC
  Administered 2021-03-03: 01:00:00 2.5 [IU]/h via INTRAVENOUS
  Filled 2021-03-01 (×2): qty 500

## 2021-03-01 MED ORDER — LIDOCAINE HCL (PF) 1 % IJ SOLN: 30.0000 mL | INTRAMUSCULAR | Status: DC | PRN

## 2021-03-01 MED ORDER — MISOPROSTOL 25 MCG QUARTER TABLET: 25.0000 ug | ORAL_TABLET | ORAL | Status: DC | PRN

## 2021-03-01 NOTE — MAU Note (Signed)
Sent from office, failed non stress test. GHTN, for induction.  Denies pain, bleeding or leaking.  Denies HA< visual changes, epigastric pain or increase in swelling.

## 2021-03-01 NOTE — Progress Notes (Signed)
Pt presents for ROB and NST.  Pt requests cx check & sweep membranes GBS neg IOL 03/06/21 PHQ9=1 GAD7= 0

## 2021-03-01 NOTE — MAU Provider Note (Signed)
Event Date/Time   First Provider Initiated Contact with Patient 03/01/21 1300      [name] is a 20 y.o. G1P0 patient who presents to MAU for continuous monitoring as she waits for bedspace on L&D for IOL. Patient denies headache, visual disturbances, RUQ/epigastric pain, new onset swelling or weight gain.   O BP 130/77    Pulse 93    Temp 97.7 F (36.5 C) (Oral)    Resp 18    LMP 05/23/2020    SpO2 100%    Physical Exam Vitals and nursing note reviewed. Exam conducted with a chaperone present.  Constitutional:      Appearance: Normal appearance.  Cardiovascular:     Rate and Rhythm: Normal rate.     Pulses: Normal pulses.  Pulmonary:     Effort: Pulmonary effort is normal.  Abdominal:     Comments: Gravid  Skin:    Capillary Refill: Capillary refill takes less than 2 seconds.  Neurological:     Mental Status: She is alert and oriented to person, place, and time.  Psychiatric:        Mood and Affect: Mood normal.        Behavior: Behavior normal.        Thought Content: Thought content normal.        Judgment: Judgment normal.   Patient Vitals for the past 24 hrs:  BP Temp Temp src Pulse Resp SpO2  03/01/21 1614 132/79 -- -- 86 -- --  03/01/21 1444 129/77 -- -- 92 -- --  03/01/21 1250 130/77 -- -- 93 -- 100 %  03/01/21 1229 126/72 97.7 F (36.5 C) Oral 91 18 100 %     A Medical screening exam complete GHTN, no severe range or severe symptoms NRNST in office this morning Cat I in MAU. Baseline 130 , mod var, + accels, no decels   P Admit to L&D for IOL  Clayton Bibles, CNM 03/01/2021 6:36 PM

## 2021-03-01 NOTE — Progress Notes (Signed)
Patient Vitals for the past 4 hrs:  BP Pulse  03/01/21 2156 (!) 148/98 91   Crampy, would like an Ambien. FHR Cat 1.  UI on EFM.  Foley still in tight.  Continue cytotec until balloon falls out.

## 2021-03-01 NOTE — Progress Notes (Signed)
° °  PRENATAL VISIT NOTE  Subjective:  Joyce Taylor is a 20 y.o. G1P0 at [redacted]w[redacted]d being seen today for ongoing prenatal care.  She is currently monitored for the following issues for this low-risk pregnancy and has Encounter for supervision of normal first pregnancy in third trimester; Alpha thalassemia silent carrier; Chlamydia trachomatis infection in pregnancy; [redacted] weeks gestation of pregnancy; and Post term pregnancy over 40 weeks on their problem list.  Patient doing well with no acute concerns today. She reports no complaints.  Contractions: Irritability. Vag. Bleeding: None.  Movement: Present. Denies leaking of fluid.   Pt denies headaches, visual changes or RUQ pain.  The following portions of the patient's history were reviewed and updated as appropriate: allergies, current medications, past family history, past medical history, past social history, past surgical history and problem list. Problem list updated.  Objective:   Vitals:   03/01/21 0945 03/01/21 0946  BP: 140/88 (!) 141/92  Pulse: 60   Weight: 175 lb (79.4 kg)     Fetal Status: Fetal Heart Rate (bpm): NST    Movement: Present     General:  Alert, oriented and cooperative. Patient is in no acute distress.  Skin: Skin is warm and dry. No rash noted.   Cardiovascular: Normal heart rate noted  Respiratory: Normal respiratory effort, no problems with respiration noted  Abdomen: Soft, gravid, appropriate for gestational age.  Pain/Pressure: Present     Pelvic: Cervical exam deferred        Extremities: Normal range of motion.  Edema: None  Mental Status:  Normal mood and affect. Normal behavior. Normal judgment and thought content.   Assessment and Plan:  Pregnancy: G1P0 at [redacted]w[redacted]d  1. Encounter for supervision of normal first pregnancy in third trimester Pt had repeated elevated blood pressure this visit with nonreactive NST.  Contacted call MD, pt sent to MAU for further evalauation and possible admission for gestational  hypertension - POCT Urinalysis Dipstick  2. [redacted] weeks gestation of pregnancy   3. Alpha thalassemia silent carrier   4. Post term pregnancy over 40 weeks Pt sent for possible delivery, gestational hypertension at term - Fetal nonstress test - POCT Urinalysis Dipstick  Term labor symptoms and general obstetric precautions including but not limited to vaginal bleeding, contractions, leaking of fluid and fetal movement were reviewed in detail with the patient.  Please refer to After Visit Summary for other counseling recommendations.   No follow-ups on file.   Mariel Aloe, MD Faculty Attending [facility]

## 2021-03-01 NOTE — MAU Note (Addendum)
Sent from [facility] office secondary non reactive NST and elevated BP's.  Denies VB or LOF.  Endorses +FM.

## 2021-03-01 NOTE — H&P (Signed)
OBSTETRIC ADMISSION HISTORY AND PHYSICAL  Joyce Taylor is a 20 y.o. female G1P0 with IUP at 37w2dby LMP presenting for IOL due to NR NST at term. She reports +FMs, no LOF, no VB, no blurry vision, headaches, peripheral edema, or RUQ pain.  She plans on breast and bottle feeding. She requests OCPs for birth control postpartum.  She received her prenatal care at [facility].   Dating: By LMP --->  Estimated Date of Delivery: 02/27/21  Sono:   @[redacted]w[redacted]d , CWD, normal anatomy, cephalic presentation, posterior placental lie, 406 g, 32% EFW  Prenatal History/Complications:  Alpha thalassemia carrier  Chlamydia in pregnancy (treated x2)   Past Medical History: Past Medical History:  Diagnosis Date   Headache     Past Surgical History: No past surgical history on file.  Obstetrical History: OB History     Gravida  1   Para      Term      Preterm      AB      Living         SAB      IAB      Ectopic      Multiple      Live Births              Social History Social History   Socioeconomic History   Marital status: Single    Spouse name: Not on file   Number of children: Not on file   Years of education: Not on file   Highest education level: Not on file  Occupational History   Not on file  Tobacco Use   Smoking status: Never   Smokeless tobacco: Never  Vaping Use   Vaping Use: Never used  Substance and Sexual Activity   Alcohol use: Not Currently    Comment: not since confirmed pregnancy   Drug use: Not Currently   Sexual activity: Yes    Partners: Male    Birth control/protection: None  Other Topics Concern   Not on file  Social History Narrative   Not on file   Social Determinants of Health   Financial Resource Strain: Not on file  Food Insecurity: Not on file  Transportation Needs: Not on file  Physical Activity: Not on file  Stress: Not on file  Social Connections: Not on file    Family History: Family History  Problem Relation  Age of Onset   Breast cancer Mother 354   Allergies: No Known Allergies  Medications Prior to Admission  Medication Sig Dispense Refill Last Dose   Acetaminophen (TYLENOL PO) Take 500 mg by mouth. (Patient not taking: Reported on 09/19/2020)      Blood Pressure Monitoring (BLOOD PRESSURE KIT) DEVI 1 kit by Does not apply route once a week. (Patient not taking: Reported on 10/17/2020) 1 each 0    Prenatal Vit-Fe Phos-FA-Omega (VITAFOL GUMMIES) 3.33-0.333-34.8 MG CHEW Chew 3 tablets by mouth daily before breakfast. (Patient not taking: Reported on 03/01/2021) 90 tablet 11      Review of Systems  All systems reviewed and negative except as stated in HPI  Blood pressure 126/72, pulse 91, temperature 97.7 F (36.5 C), temperature source Oral, resp. rate 18, last menstrual period 05/23/2020, SpO2 100 %.  General appearance: alert, cooperative, and no distress Lungs: normal work of breathing on room air  Heart: normal rate, warm and well perfused  Abdomen: soft, non-tender, gravid  Extremities: no LE edema or calf tenderness to palpation   Presentation:  Cephalic Fetal monitoring: Baseline 150, moderate variability, + accels, absent decels  Uterine activity: Contractions irregular CE 2/60/-2, soft, posterior Foley bulb placed (pt consented prior to placement and she was agreeable if I felt it was possible) and filled with 60 cc.      Prenatal labs: ABO, Rh: A/Positive/-- (06/15 1154) Antibody: Negative (06/15 1154) Rubella: 2.48 (06/15 1154) RPR: Non Reactive (09/21 0826)  HBsAg: Negative (06/15 1154)  HIV: Non Reactive (09/21 0826)  GBS: Negative/-- (11/28 1543)  2 hr Glucola normal  Genetic screening - LR NIPS, silent carrier for alpha thalassemia  Anatomy US normal   Prenatal Transfer Tool  Maternal Diabetes: No Genetic Screening: LR NIPS, silent carrier for alpha thalassemia  Maternal Ultrasounds/Referrals: Normal Fetal Ultrasounds or other Referrals:  None Maternal  Substance Abuse:  No Significant Maternal Medications:  None Significant Maternal Lab Results: Group B Strep negative  Results for orders placed or performed in visit on 03/01/21 (from the past 24 hour(s))  POCT Urinalysis Dipstick   Collection Time: 03/01/21  9:49 AM  Result Value Ref Range   Color, UA yellow    Clarity, UA cloudy    Glucose, UA Negative Negative   Bilirubin, UA negative    Ketones, UA negative    Spec Grav, UA 1.010 1.010 - 1.025   Blood, UA negative    pH, UA 7.0 5.0 - 8.0   Protein, UA Positive (A) Negative   Urobilinogen, UA 0.2 0.2 or 1.0 E.U./dL   Nitrite, UA negative    Leukocytes, UA Negative Negative   Appearance     Odor      Patient Active Problem List   Diagnosis Date Noted   [redacted] weeks gestation of pregnancy 03/01/2021   Post term pregnancy over 40 weeks 03/01/2021   Alpha thalassemia silent carrier 09/19/2020   Chlamydia trachomatis infection in pregnancy 09/19/2020   Encounter for supervision of normal first pregnancy in third trimester 08/15/2020    Assessment/Plan:  Joyce Taylor is a 20 y.o. G1P0 at 68w2dhere for IOL due to NR NST at term.   #Labor: Foley bulb placed and will do miso for at least one dose.  #Pain: PRN #FWB: Cat 1 #ID:  GBS neg #MOF: Breast/bottle  #MOC: OCPs  #GHTN: Normal preE labs, no severe features  PRadene Gunning MD 03/01/2021, 12:40 PM

## 2021-03-02 ENCOUNTER — Inpatient Hospital Stay (HOSPITAL_COMMUNITY): Payer: 59 | Admitting: Anesthesiology

## 2021-03-02 ENCOUNTER — Encounter (HOSPITAL_COMMUNITY): Payer: Self-pay | Admitting: Obstetrics and Gynecology

## 2021-03-02 LAB — CBC
HCT: 28.4 % — ABNORMAL LOW (ref 36.0–46.0)
HCT: 33.1 % — ABNORMAL LOW (ref 36.0–46.0)
Hemoglobin: 9.3 g/dL — ABNORMAL LOW (ref 12.0–15.0)
Hemoglobin: 10.7 g/dL — ABNORMAL LOW (ref 12.0–15.0)
MCH: 27.1 pg (ref 26.0–34.0)
MCH: 26.9 pg (ref 26.0–34.0)
MCHC: 32.7 g/dL (ref 30.0–36.0)
MCHC: 32.3 g/dL (ref 30.0–36.0)
MCV: 82.8 fL (ref 80.0–100.0)
MCV: 83.2 fL (ref 80.0–100.0)
Platelets: 159 10*3/uL (ref 150–400)
Platelets: 181 10*3/uL (ref 150–400)
RBC: 3.43 MIL/uL — ABNORMAL LOW (ref 3.87–5.11)
RBC: 3.98 MIL/uL (ref 3.87–5.11)
RDW: 13.9 % (ref 11.5–15.5)
RDW: 13.7 % (ref 11.5–15.5)
WBC: 7.8 10*3/uL (ref 4.0–10.5)
WBC: 9.3 10*3/uL (ref 4.0–10.5)
nRBC: 0 % (ref 0.0–0.2)
nRBC: 0 % (ref 0.0–0.2)

## 2021-03-02 LAB — RPR: RPR Ser Ql: NONREACTIVE

## 2021-03-02 MED ORDER — EPHEDRINE 5 MG/ML INJ: 10.0000 mg | INTRAVENOUS | Status: DC | PRN

## 2021-03-02 MED ORDER — LIDOCAINE HCL (PF) 1 % IJ SOLN
INTRAMUSCULAR | Status: DC | PRN
  Administered 2021-03-02: 4 mL via EPIDURAL
  Administered 2021-03-02 (×2): 5 mL via EPIDURAL

## 2021-03-02 MED ORDER — DIPHENHYDRAMINE HCL 50 MG/ML IJ SOLN: 12.5000 mg | INTRAMUSCULAR | Status: DC | PRN

## 2021-03-02 MED ORDER — PHENYLEPHRINE 40 MCG/ML (10ML) SYRINGE FOR IV PUSH (FOR BLOOD PRESSURE SUPPORT)
80.0000 ug | PREFILLED_SYRINGE | INTRAVENOUS | Status: DC | PRN
  Filled 2021-03-02: qty 10

## 2021-03-02 MED ORDER — LACTATED RINGERS IV SOLN
500.0000 mL | Freq: Once | INTRAVENOUS | Status: AC
  Administered 2021-03-02: 17:00:00 500 mL via INTRAVENOUS

## 2021-03-02 MED ORDER — PHENYLEPHRINE 40 MCG/ML (10ML) SYRINGE FOR IV PUSH (FOR BLOOD PRESSURE SUPPORT): 80.0000 ug | PREFILLED_SYRINGE | INTRAVENOUS | Status: DC | PRN

## 2021-03-02 MED ORDER — FENTANYL-BUPIVACAINE-NACL 0.5-0.125-0.9 MG/250ML-% EP SOLN
12.0000 mL/h | EPIDURAL | Status: DC | PRN
  Administered 2021-03-02: 17:00:00 12 mL/h via EPIDURAL
  Filled 2021-03-02: qty 250

## 2021-03-02 NOTE — Progress Notes (Signed)
Patient ID: Joyce Taylor, female   DOB: [birth year], 20 y.o.   MRN: [id number] Pushing well but tired  Vitals:   03/02/21 2130 03/02/21 2200 03/02/21 2230 03/02/21 2300  BP: 126/90 (!) 152/73 (!) 141/92 127/77  Pulse: (!) 105 (!) 138 (!) 117 (!) 113  Resp:      Temp:      TempSrc:      SpO2:       FHR stable  Will try turning to sides and perhaps hands and knees after that to facilitate rotation

## 2021-03-02 NOTE — Progress Notes (Signed)
Joyce Taylor is a 20 y.o. G1P0 at [redacted]w[redacted]d for IOL due to NRNST and GHTN  Subjective: She is comfortable.   Objective: BP 112/67    Pulse 84    Temp 98.9 F (37.2 C) (Oral)    Resp 16    LMP 05/23/2020    SpO2 100%  No intake/output data recorded. No intake/output data recorded.  FHT:  FHR: 140 bpm, variability: moderate,  accelerations:  Present,  decelerations:  Absent UC:   irregular, every 2-6 minutes SVE:   Dilation: 5 Effacement (%): 60 Station: -3 Exam by:: Para March, MD  Labs: Lab Results  Component Value Date   WBC 6.6 03/01/2021   HGB 9.5 (L) 03/01/2021   HCT 30.0 (L) 03/01/2021   MCV 83.8 03/01/2021   PLT 177 03/01/2021    Assessment / Plan: Induction of labor due to gestational hypertension and NR NST,  progressing well   Labor:  Now s/p FB. Start Pitocin. Reviewed plan for AROM Preeclampsia:  no signs or symptoms of toxicity Fetal Wellbeing:  Category I Pain Control:   Per pt request I/D:   GBS Neg Anticipated MOD:  NSVD  Milas Hock 03/02/2021, 5:00 AM

## 2021-03-02 NOTE — Anesthesia Procedure Notes (Signed)
Epidural Patient location during procedure: OB Start time: 03/02/2021 4:54 PM End time: 03/02/2021 5:04 PM  Staffing Anesthesiologist: Mal Amabile, MD Performed: anesthesiologist   Preanesthetic Checklist Completed: patient identified, IV checked, site marked, risks and benefits discussed, surgical consent, monitors and equipment checked, pre-op evaluation and timeout performed  Epidural Patient position: sitting Prep: DuraPrep and site prepped and draped Patient monitoring: continuous pulse ox and blood pressure Approach: midline Location: L3-L4 Injection technique: LOR air  Needle:  Needle type: Tuohy  Needle gauge: 17 G Needle length: 9 cm and 9 Needle insertion depth: 4 cm Catheter type: closed end flexible Catheter size: 19 Gauge Catheter at skin depth: 9 cm Test dose: negative and Other  Assessment Events: blood not aspirated, injection not painful, no injection resistance, no paresthesia and negative IV test  Additional Notes Patient identified. Risks and benefits discussed including failed block, incomplete  Pain control, post dural puncture headache, nerve damage, paralysis, blood pressure Changes, nausea, vomiting, reactions to medications-both toxic and allergic and post Partum back pain. All questions were answered. Patient expressed understanding and wished to proceed. Sterile technique was used throughout procedure. Epidural site was Dressed with sterile barrier dressing. No paresthesias, signs of intravascular injection Or signs of intrathecal spread were encountered.  Patient was more comfortable after the epidural was dosed. Please see RN's note for documentation of vital signs and FHR which are stable. Reason for block:procedure for pain

## 2021-03-02 NOTE — Progress Notes (Signed)
Patient ID: Joyce Taylor, female   DOB: 2000/05/20, 20 y.o.   MRN: [id number] Patient now complete and pushing.   AVSS  FHR reassuring Pushing well  Dilation: 10 Dilation Complete Date: 03/02/21 Dilation Complete Time: 2126 Effacement (%): 100 Station: Plus 1, Plus 2 Presentation: Vertex Exam by:: HopkinsRN  Anticipate SVD

## 2021-03-02 NOTE — Anesthesia Preprocedure Evaluation (Addendum)
Anesthesia Evaluation  Patient identified by MRN, date of birth, ID band Patient awake    Reviewed: Allergy & Precautions, Patient's Chart, lab work & pertinent test results  Airway Mallampati: II       Dental no notable dental hx.    Pulmonary neg pulmonary ROS,    Pulmonary exam normal        Cardiovascular negative cardio ROS Normal cardiovascular exam     Neuro/Psych  Headaches, negative psych ROS   GI/Hepatic Neg liver ROS, GERD  ,  Endo/Other  negative endocrine ROS  Renal/GU negative Renal ROS  negative genitourinary   Musculoskeletal negative musculoskeletal ROS (+)   Abdominal   Peds  Hematology  (+) anemia ,   Anesthesia Other Findings   Reproductive/Obstetrics (+) Pregnancy                             Anesthesia Physical Anesthesia Plan  ASA: 2  Anesthesia Plan: Epidural   Post-op Pain Management:    Induction:   PONV Risk Score and Plan:   Airway Management Planned: Natural Airway  Additional Equipment:   Intra-op Plan:   Post-operative Plan:   Informed Consent: I have reviewed the patients History and Physical, chart, labs and discussed the procedure including the risks, benefits and alternatives for the proposed anesthesia with the patient or authorized representative who has indicated his/her understanding and acceptance.       Plan Discussed with: Anesthesiologist  Anesthesia Plan Comments:         Anesthesia Quick Evaluation

## 2021-03-02 NOTE — Progress Notes (Signed)
Labor Progress Note Joyce Taylor is a 20 y.o. G1P0 at [redacted]w[redacted]d presented for IOL due to NR NST.   S: Doing well. No concerns. Doula and FOB at bedside.   O:  BP 111/89    Pulse 84    Temp 98.2 F (36.8 C) (Oral)    Resp 16    LMP 05/23/2020    SpO2 100%   EFM: Baseline 130 bpm, moderate variability, + accels, no decels  Toco: Every 2-3 minutes   CVE: Dilation: 5 Effacement (%): 70 Station: -2 Presentation: Vertex Exam by:: Dr. Mathis Fare   A&P: 20 y.o. G1P0 [redacted]w[redacted]d   #Labor: SVE similar to prior. Discussed AROM and patient verbally consented. AROM performed with light meconium stained fluid. Fetal head well applied. Mom and baby tolerated well. Will continue Pitocin and reassess in 3-4 hours.  #Pain: PRN; planning for epidural  #FWB: Cat 1  #GBS negative  #gHTN: Normal range BP. No symptoms. Will continue to monitor.  Worthy Rancher, MD 4:58 PM

## 2021-03-03 ENCOUNTER — Encounter (HOSPITAL_COMMUNITY): Payer: Self-pay | Admitting: Obstetrics and Gynecology

## 2021-03-03 DIAGNOSIS — O480 Post-term pregnancy: Secondary | ICD-10-CM

## 2021-03-03 DIAGNOSIS — Z3A40 40 weeks gestation of pregnancy: Secondary | ICD-10-CM

## 2021-03-03 DIAGNOSIS — O134 Gestational [pregnancy-induced] hypertension without significant proteinuria, complicating childbirth: Secondary | ICD-10-CM

## 2021-03-03 LAB — CBC
HCT: 24 % — ABNORMAL LOW (ref 36.0–46.0)
Hemoglobin: 7.8 g/dL — ABNORMAL LOW (ref 12.0–15.0)
MCH: 26.9 pg (ref 26.0–34.0)
MCHC: 32.5 g/dL (ref 30.0–36.0)
MCV: 82.8 fL (ref 80.0–100.0)
Platelets: 151 10*3/uL (ref 150–400)
RBC: 2.9 MIL/uL — ABNORMAL LOW (ref 3.87–5.11)
RDW: 13.7 % (ref 11.5–15.5)
WBC: 11 10*3/uL — ABNORMAL HIGH (ref 4.0–10.5)
nRBC: 0 % (ref 0.0–0.2)

## 2021-03-03 MED ORDER — WITCH HAZEL-GLYCERIN EX PADS: 1.0000 "application " | MEDICATED_PAD | CUTANEOUS | Status: DC | PRN

## 2021-03-03 MED ORDER — DIBUCAINE (PERIANAL) 1 % EX OINT: 1.0000 "application " | TOPICAL_OINTMENT | CUTANEOUS | Status: DC | PRN

## 2021-03-03 MED ORDER — ONDANSETRON HCL 4 MG PO TABS: 4.0000 mg | ORAL_TABLET | ORAL | Status: DC | PRN

## 2021-03-03 MED ORDER — TETANUS-DIPHTH-ACELL PERTUSSIS 5-2.5-18.5 LF-MCG/0.5 IM SUSY: 0.5000 mL | PREFILLED_SYRINGE | Freq: Once | INTRAMUSCULAR | Status: DC

## 2021-03-03 MED ORDER — DIPHENHYDRAMINE HCL 25 MG PO CAPS: 25.0000 mg | ORAL_CAPSULE | Freq: Four times a day (QID) | ORAL | Status: DC | PRN

## 2021-03-03 MED ORDER — COCONUT OIL OIL: 1.0000 "application " | TOPICAL_OIL | Status: DC | PRN

## 2021-03-03 MED ORDER — SIMETHICONE 80 MG PO CHEW: 80.0000 mg | CHEWABLE_TABLET | ORAL | Status: DC | PRN

## 2021-03-03 MED ORDER — ZOLPIDEM TARTRATE 5 MG PO TABS: 5.0000 mg | ORAL_TABLET | Freq: Every evening | ORAL | Status: DC | PRN

## 2021-03-03 MED ORDER — BENZOCAINE-MENTHOL 20-0.5 % EX AERO
1.0000 "application " | INHALATION_SPRAY | CUTANEOUS | Status: DC | PRN
  Administered 2021-03-03: 1 via TOPICAL
  Filled 2021-03-03: qty 56

## 2021-03-03 MED ORDER — PRENATAL MULTIVITAMIN CH
1.0000 | ORAL_TABLET | Freq: Every day | ORAL | Status: DC
  Administered 2021-03-03: 13:00:00 1 via ORAL
  Filled 2021-03-03: qty 1

## 2021-03-03 MED ORDER — ACETAMINOPHEN 325 MG PO TABS
650.0000 mg | ORAL_TABLET | ORAL | Status: DC | PRN
  Administered 2021-03-03: 03:00:00 650 mg via ORAL
  Filled 2021-03-03 (×2): qty 2

## 2021-03-03 MED ORDER — ONDANSETRON HCL 4 MG/2ML IJ SOLN: 4.0000 mg | INTRAMUSCULAR | Status: DC | PRN

## 2021-03-03 MED ORDER — SENNOSIDES-DOCUSATE SODIUM 8.6-50 MG PO TABS
2.0000 | ORAL_TABLET | ORAL | Status: DC
  Administered 2021-03-03 – 2021-03-04 (×2): 2 via ORAL
  Filled 2021-03-03 (×2): qty 2

## 2021-03-03 MED ORDER — IBUPROFEN 600 MG PO TABS
600.0000 mg | ORAL_TABLET | Freq: Four times a day (QID) | ORAL | Status: DC
  Administered 2021-03-03 – 2021-03-04 (×4): 600 mg via ORAL
  Filled 2021-03-03 (×5): qty 1

## 2021-03-03 NOTE — Lactation Note (Signed)
This note was copied from a baby's chart. Lactation Consultation Note  Patient Name: Joyce Taylor [id number] Date: 03/03/2021   Age:21 hours  Mother sleeping.  Lactation to follow up.   Consult Status      Dahlia Byes [facility] 03/03/2021, 10:12 AM

## 2021-03-03 NOTE — Anesthesia Postprocedure Evaluation (Signed)
Anesthesia Post Note  Patient: Joyce Taylor  Procedure(s) Performed: AN AD HOC LABOR EPIDURAL     Patient location during evaluation: Mother Baby Anesthesia Type: Epidural Level of consciousness: awake and alert and oriented Pain management: satisfactory to patient Vital Signs Assessment: post-procedure vital signs reviewed and stable Respiratory status: respiratory function stable Cardiovascular status: stable Postop Assessment: no headache, no backache, epidural receding, patient able to bend at knees, no signs of nausea or vomiting, adequate PO intake and able to ambulate Anesthetic complications: no   No notable events documented.  Last Vitals:  Vitals:   03/03/21 0345 03/03/21 0745  BP: 122/83 101/64  Pulse: 96 91  Resp:  16  Temp: 36.7 C 36.9 C  SpO2: 99% 99%    Last Pain:  Vitals:   03/03/21 0745  TempSrc: Oral  PainSc:    Pain Goal: Patients Stated Pain Goal: 0 (03/02/21 1621)                 Karleen Dolphin

## 2021-03-03 NOTE — Discharge Summary (Signed)
Postpartum Discharge Summary  Date of Service updated-yes     Patient Name: Joyce Taylor DOB: 2000/04/10 MRN: [id number]  Date of admission: 03/01/2021 Delivery date:03/03/2021  Delivering provider: Seabron Spates  Date of discharge: 03/04/2021  Admitting diagnosis: Gestational Hypertension, Nonreactive NST Intrauterine pregnancy: [redacted]w[redacted]d    Secondary diagnosis:   Additional problems: excessive bleeding prior to delivery of placenta    Discharge diagnosis: Term Pregnancy Delivered, Gestational Hypertension, and Nonreactive Nonstress Test                                               Post partum procedures: none Augmentation: AROM, Pitocin, Cytotec, and IP Foley Complications: None  Hospital course: Induction of Labor With Vaginal Delivery   20y.o. yo G1P0 at 435w4das admitted to the hospital 03/01/2021 for induction of labor.  Indication for induction: Gestational hypertension and Nonreactive NST .  Patient had an uncomplicated labor course as follows: Membrane Rupture Time/Date: 3:18 PM ,03/02/2021   Delivery Method:Vaginal, Spontaneous  Episiotomy: None  Lacerations:  1st degree;Perineal  Details of delivery can be found in separate delivery note.  EBL 1000 mL. Patient had a routine postpartum course. On PPD #1 she is ambulating, voiding, and pain well controlled. Denies orthostatic signs. Hgb dropped from 9.3 to 7.8. Oral Fe ordered. BP is normal. Patient is discharged home 03/04/21.  Newborn Data: Birth date:03/03/2021  Birth time:12:24 AM  Gender:Female  Living status:Living  Apgars:8 ,9  Weight:3459 g   Magnesium Sulfate received: No BMZ received: No Rhophylac:N/A MMR:No T-DaP: declined Flu: No Transfusion:No  Physical exam  Vitals:   03/03/21 0745 03/03/21 1513 03/03/21 2200 03/04/21 0614  BP: 101/64 125/73 121/75 120/78  Pulse: 91 80 82 66  Resp: _0 Temp: 98.5 F (36.9 C) 98.9 F (37.2 C) 98 F (36.7 C) 97.8 F (36.6 C)  TempSrc:  Oral Oral Oral Oral  SpO2: 99% 100% 100% 100%   General: alert, cooperative, and no distress.  Lochia: appropriate Uterine Fundus: firm Incision: N/A DVT Evaluation: No evidence of DVT seen on physical exam. Negative Homan's sign. No cords or calf tenderness. No significant calf/ankle edema. Labs: Lab Results  Component Value Date   WBC 11.0 (H) 03/03/2021   HGB 7.8 (L) 03/03/2021   HCT 24.0 (L) 03/03/2021   MCV 82.8 03/03/2021   PLT 151 03/03/2021   No flowsheet data found. Edinburgh Score: Edinburgh Postnatal Depression Scale Screening Tool 03/03/2021  I have been able to laugh and see the funny side of things. 0  I have looked forward with enjoyment to things. 0  I have blamed myself unnecessarily when things went wrong. 1  I have been anxious or worried for no good reason. 0  I have felt scared or panicky for no good reason. 0  Things have been getting on top of me. 0  I have been so unhappy that I have had difficulty sleeping. 0  I have felt sad or miserable. 0  I have been so unhappy that I have been crying. 0  The thought of harming myself has occurred to me. 0  Edinburgh Postnatal Depression Scale Total 1      After visit meds:  Allergies as of 03/04/2021   No Known Allergies      Medication List     TAKE these medications  acetaminophen 500 MG tablet Commonly known as: TYLENOL Take 500 mg by mouth every 6 (six) hours as needed for mild pain.   Blood Pressure Kit Devi 1 kit by Does not apply route once a week.   ibuprofen 600 MG tablet Commonly known as: ADVIL Take 1 tablet (600 mg total) by mouth every 6 (six) hours.   iron polysaccharides 150 MG capsule Commonly known as: NIFEREX Take 1 capsule (150 mg total) by mouth daily.   Vitafol Gummies 3.33-0.333-34.8 MG Chew Chew 3 tablets by mouth daily before breakfast.       Please schedule this patient for Postpartum visit in: 1 week with the following provider: Any provider In-Person For  C/S patients schedule nurse incision check in weeks 2 weeks: no High Risk pregnancy complicated by: HTN Delivery mode:  SVD Anticipated Birth Control:  POPs PP Procedures needed: BP check  Edinburgh: negative Schedule Integrated [place] visit: no  No relevant baby issues  Discharge home in stable condition Infant Feeding: Bottle and Breast Infant Disposition:home with mother Discharge instruction: per After Visit Summary and Postpartum booklet. Activity: Advance as tolerated. Pelvic rest for 6 weeks.  Diet: routine diet Anticipated Birth Control: POPs Postpartum Appointment:1 week Additional Postpartum F/U: BP check 1 week Future Appointments:No future appointments. Follow up Visit:  [place]. Go in 1 week(s).   Why: for blood pressure check Contact information: [address] [place] [address] [phone]                03/04/2021 Julianne Handler, CNM

## 2021-03-03 NOTE — Lactation Note (Signed)
This note was copied from a baby's chart. Lactation Consultation Note  Patient Name: Joyce Taylor MSXJD'B Date: 03/03/2021 Reason for consult: Initial assessment;Term;RN request;[place] Age:21 hours   P1 mom, sleepy baby.  LC assess suck, infant sleeping and would not suck.  Lingual frenulum easily visible when infant opened mouth to cry.  Suck training  done using mom's colostrum which was collected into a spoon.  Infant sucked off gloved finger.  Spoon feeding demonstrated for 1-2 times before infant fell asleep again.  Infant placed in laid back position; took a few sucks then would suck her own tongue.   Eventually, infant sustained latched and good rhythmic jaw movements were noted with some swallows.  Dad very active with assisting mom and with hand expression.  BF basics reviewed and mom was encouraged to hold baby STS, hand express into a spoon and feed back any colostrum to baby along with latching with cues.  Mom is aware of lactation resources: OP LC visit, BFSG, and phone line.       Maternal Data Has patient been taught Hand Expression?: Yes Does the patient have breastfeeding experience prior to this delivery?: No  Feeding Mother's Current Feeding Choice: Breast Milk  LATCH Score Latch: Repeated attempts needed to sustain latch, nipple held in mouth throughout feeding, stimulation needed to elicit sucking reflex.  Audible Swallowing: A few with stimulation  Type of Nipple: Everted at rest and after stimulation  Comfort (Breast/Nipple): Soft / non-tender  Hold (Positioning): Assistance needed to correctly position infant at breast and maintain latch.  LATCH Score: 7   Lactation Tools Discussed/Used    Interventions Interventions: Breast feeding basics reviewed;Assisted with latch;Skin to skin;Breast massage;Hand express;Adjust position;Breast compression;Position options;Expressed milk;Education;LC Services brochure  Discharge    Consult  Status Consult Status: Follow-up Date: 03/04/21 Follow-up type: In-patient    Maryruth Hancock [facility] 03/03/2021, 5:32 PM

## 2021-03-04 DIAGNOSIS — D62 Acute posthemorrhagic anemia: Secondary | ICD-10-CM

## 2021-03-04 MED ORDER — IBUPROFEN 600 MG PO TABS: 600.0000 mg | ORAL_TABLET | Freq: Four times a day (QID) | ORAL | 0 refills | Status: AC

## 2021-03-04 MED ORDER — POLYSACCHARIDE IRON COMPLEX 150 MG PO CAPS: 150.0000 mg | ORAL_CAPSULE | Freq: Every day | ORAL | Status: DC

## 2021-03-04 MED ORDER — POLYSACCHARIDE IRON COMPLEX 150 MG PO CAPS: 150.0000 mg | ORAL_CAPSULE | Freq: Every day | ORAL | 1 refills | Status: AC

## 2021-03-04 NOTE — Lactation Note (Signed)
This note was copied from a baby's chart. Lactation Consultation Note  Patient Name: Joyce Taylor RUEAV'W Date: 03/04/2021 Reason for consult: Follow-up assessment;Difficult latch;RN request;1st time breastfeeding Age:21 hours  P1, Baby has been having difficulty sustaining latch. Lingual frenulum visible.  Had mother hand express before latching. Mother has good flow of colostrum. Baby latched with ease in cross cradle on both breasts with intermittent swallows. Set up DEBP and recommend if baby continues to struggle with latching pump 4-5 times per day and give volume back to baby.   Maternal Data Has patient been taught Hand Expression?: Yes Does the patient have breastfeeding experience prior to this delivery?: No  Feeding Mother's Current Feeding Choice: Breast Milk  LATCH Score Latch: Grasps breast easily, tongue down, lips flanged, rhythmical sucking.  Audible Swallowing: A few with stimulation  Type of Nipple: Everted at rest and after stimulation  Comfort (Breast/Nipple): Soft / non-tender  Hold (Positioning): Assistance needed to correctly position infant at breast and maintain latch.  LATCH Score: 8   Lactation Tools Discussed/Used    Interventions Interventions: Breast feeding basics reviewed;Assisted with latch;Skin to skin;Hand express;Pre-pump if needed;Adjust position;Support pillows;DEBP;Hand pump;Education  Discharge Pump: DEBP;Personal (Spectra)  Consult Status Consult Status: Follow-up Date: 03/05/21 Follow-up type: In-patient    Dahlia Byes [facility] 03/04/2021, 10:45 AM

## 2021-03-04 NOTE — Lactation Note (Signed)
This note was copied from a baby's chart. Lactation Consultation Note  Patient Name: Joyce Taylor QQPYP'P Date: 03/04/2021 Reason for consult: Follow-up assessment;Difficult latch;RN request;1st time breastfeeding Age:21 hours  Maternal Data Has patient been taught Hand Expression?: Yes Does the patient have breastfeeding experience prior to this delivery?: No  Feeding Mother's Current Feeding Choice: Breast Milk  LATCH Score Latch: Grasps breast easily, tongue down, lips flanged, rhythmical sucking.  Audible Swallowing: A few with stimulation  Type of Nipple: Everted at rest and after stimulation  Comfort (Breast/Nipple): Soft / non-tender  Hold (Positioning): Assistance needed to correctly position infant at breast and maintain latch.  LATCH Score: 8   Lactation Tools Discussed/Used Tools: Pump Breast pump type: Double-Electric Breast Pump;Manual Pump Education: Setup, frequency, and cleaning;Milk Storage Reason for Pumping: stimulation and supplementation Pumping frequency: 4- 5 times per day  Interventions Interventions: Breast feeding basics reviewed;Assisted with latch;Skin to skin;Hand express;Pre-pump if needed;Adjust position;Support pillows;DEBP;Hand pump;Education  Discharge Pump: DEBP;Personal (Spectra)  Consult Status Consult Status: Follow-up Date: 03/05/21 Follow-up type: In-patient    Dahlia Byes [facility] 03/04/2021, 10:47 AM

## 2021-03-06 ENCOUNTER — Inpatient Hospital Stay (HOSPITAL_COMMUNITY): Payer: 59

## 2021-03-06 ENCOUNTER — Inpatient Hospital Stay (HOSPITAL_COMMUNITY): Admission: AD | Admit: 2021-03-06 | Payer: 59 | Source: Home / Self Care | Admitting: Family Medicine

## 2021-03-12 ENCOUNTER — Ambulatory Visit: Payer: 59

## 2021-03-15 ENCOUNTER — Telehealth (HOSPITAL_COMMUNITY): Payer: Self-pay | Admitting: *Deleted

## 2021-03-15 NOTE — Telephone Encounter (Signed)
Voice mailbox full. No message left.  Duffy Rhody, RN 03-15-2021 at 9:32am

## 2021-03-27 ENCOUNTER — Ambulatory Visit: Payer: 59 | Admitting: Obstetrics

## 2021-04-08 ENCOUNTER — Encounter: Payer: Self-pay | Admitting: Obstetrics

## 2021-04-16 ENCOUNTER — Other Ambulatory Visit: Payer: Self-pay

## 2021-04-16 ENCOUNTER — Ambulatory Visit (INDEPENDENT_AMBULATORY_CARE_PROVIDER_SITE_OTHER): Payer: Medicaid Other | Admitting: Obstetrics

## 2021-04-16 ENCOUNTER — Encounter: Payer: Self-pay | Admitting: Obstetrics

## 2021-04-16 DIAGNOSIS — R52 Pain, unspecified: Secondary | ICD-10-CM | POA: Diagnosis not present

## 2021-04-16 DIAGNOSIS — Z30011 Encounter for initial prescription of contraceptive pills: Secondary | ICD-10-CM | POA: Diagnosis not present

## 2021-04-16 DIAGNOSIS — Z3009 Encounter for other general counseling and advice on contraception: Secondary | ICD-10-CM | POA: Diagnosis not present

## 2021-04-16 MED ORDER — IBUPROFEN 800 MG PO TABS: 800.0000 mg | ORAL_TABLET | Freq: Three times a day (TID) | ORAL | 5 refills | Status: AC | PRN

## 2021-04-16 MED ORDER — LO LOESTRIN FE 1 MG-10 MCG / 10 MCG PO TABS
1.0000 | ORAL_TABLET | Freq: Every day | ORAL | 4 refills | Status: AC
Start: 2021-04-16 — End: ?

## 2021-04-16 NOTE — Progress Notes (Signed)
Patient presents for PP visit. Patient desires pills for contraception. Patient states that she has been sexually active one time since delivery. Cycle lasted for about 5 days. She states that she used a condom. Patient denies having any concerns or pain from laceration.

## 2021-04-16 NOTE — Progress Notes (Signed)
Post Partum Visit Note  Joyce Taylor is a 21 y.o. G32P1001 female who presents for a postpartum visit. She is 6 weeks postpartum following a normal spontaneous vaginal delivery.  I have fully reviewed the prenatal and intrapartum course. The delivery was at 40 gestational weeks.  Anesthesia: epidural. Postpartum course has been uncomplicated. Baby is doing well. Baby is feeding by bottle - Similac 360 . Bleeding no bleeding. Bowel function is normal. Bladder function is normal. Patient is not sexually active. Contraception method is none. Postpartum depression screening: negative.   The pregnancy intention screening data noted above was reviewed. Potential methods of contraception were discussed. The patient elected to proceed with No data recorded.   Edinburgh Postnatal Depression Scale - 04/16/21 1605       Edinburgh Postnatal Depression Scale:  In the Past 7 Days   I have been able to laugh and see the funny side of things. 0    I have looked forward with enjoyment to things. 0    I have blamed myself unnecessarily when things went wrong. 0    I have been anxious or worried for no good reason. 2    I have felt scared or panicky for no good reason. 0    Things have been getting on top of me. 0    I have been so unhappy that I have had difficulty sleeping. 0    I have felt sad or miserable. 0    I have been so unhappy that I have been crying. 0    The thought of harming myself has occurred to me. 0    Edinburgh Postnatal Depression Scale Total 2             Health Maintenance Due  Topic Date Due   HPV VACCINES (1 - 2-dose series) Never done    The following portions of the patient's history were reviewed and updated as appropriate: allergies, current medications, past family history, past medical history, past social history, past surgical history, and problem list.  Review of Systems A comprehensive review of systems was negative.  Objective:  Ht 5\' 8"  (1.727 m)    Wt  153 lb 9.6 oz (69.7 kg)    LMP 04/09/2021    Breastfeeding No    BMI 23.35 kg/m    General:  alert and no distress   Breasts:  normal  Lungs: clear to auscultation bilaterally  Heart:  regular rate and rhythm, S1, S2 normal, no murmur, click, rub or gallop  Abdomen: soft, non-tender; bowel sounds normal; no masses,  no organomegaly   Wound none  GU exam:  not indicated       Assessment:     Plan:   Essential components of care per ACOG recommendations:  1.  Mood and well being: Patient with negative depression screening today.  - Patient tobacco use? No.   - hx of drug use? No.    2. Infant care and feeding:  -Patient currently breastmilk feeding? No.  -Social determinants of health (SDOH) reviewed in EPIC. No concerns  3. Sexuality, contraception and birth spacing - Patient does not want a pregnancy in the next year.  Desired family size is undecided  - Reviewed forms of contraception in tiered fashion. Patient desired oral contraceptives (estrogen/progesterone) today.   - Discussed birth spacing of 18 months  4. Sleep and fatigue -Encouraged family/partner/community support of 4 hrs of uninterrupted sleep to help with mood and fatigue  5. Physical Recovery  -  Discussed patients delivery and complications. She describes her labor as good. - Patient had a Vaginal, no problems at delivery. Patient had a 1st degree laceration. Perineal healing reviewed. Patient expressed understanding - Patient has urinary incontinence? No. - Patient is not safe to resume physical and sexual activity  6.  Health Maintenance - HM due items addressed Yes - Last pap smear  Diagnosis  Date Value Ref Range Status  08/22/2020 - Low grade squamous intraepithelial lesion (LSIL) (A)     Pap smear not done at today's visit.  -Breast Cancer screening indicated? No.   7. Chronic Disease/Pregnancy Condition follow up: None    Coral Ceo, MD [facility], [facility], [place] 04/16/21

## 2021-05-14 ENCOUNTER — Encounter: Payer: Medicaid Other | Admitting: Obstetrics

## 2021-07-04 ENCOUNTER — Encounter (HOSPITAL_BASED_OUTPATIENT_CLINIC_OR_DEPARTMENT_OTHER): Payer: Self-pay | Admitting: Emergency Medicine

## 2021-07-04 ENCOUNTER — Emergency Department (HOSPITAL_BASED_OUTPATIENT_CLINIC_OR_DEPARTMENT_OTHER)
Admission: EM | Admit: 2021-07-04 | Discharge: 2021-07-04 | Disposition: A | Discharge status: Home / Self Care | Payer: Medicaid Other | Attending: Emergency Medicine | Admitting: Emergency Medicine

## 2021-07-04 ENCOUNTER — Emergency Department (HOSPITAL_BASED_OUTPATIENT_CLINIC_OR_DEPARTMENT_OTHER): Payer: Medicaid Other

## 2021-07-04 ENCOUNTER — Other Ambulatory Visit: Payer: Self-pay

## 2021-07-04 DIAGNOSIS — Y92410 Unspecified street and highway as the place of occurrence of the external cause: Secondary | ICD-10-CM | POA: Diagnosis not present

## 2021-07-04 DIAGNOSIS — M79652 Pain in left thigh: Secondary | ICD-10-CM | POA: Insufficient documentation

## 2021-07-04 DIAGNOSIS — M79662 Pain in left lower leg: Secondary | ICD-10-CM | POA: Insufficient documentation

## 2021-07-04 DIAGNOSIS — Z3201 Encounter for pregnancy test, result positive: Secondary | ICD-10-CM | POA: Diagnosis not present

## 2021-07-04 LAB — PREGNANCY, URINE: Preg Test, Ur: POSITIVE — AB
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
## 2021-08-29 ENCOUNTER — Encounter: Payer: Self-pay | Admitting: Advanced Practice Midwife

## 2021-10-30 ENCOUNTER — Encounter: Payer: Self-pay | Admitting: Obstetrics and Gynecology
